# Patient Record
Sex: Female | Born: 1990 | Race: Black or African American | Hispanic: No | Marital: Married | Smoking: Never smoker
Health system: Southern US, Community
[De-identification: ages and names within clinical notes are randomized; demographics above are authoritative.]

## PROBLEM LIST (undated history)

## (undated) ENCOUNTER — Inpatient Hospital Stay (HOSPITAL_COMMUNITY): Payer: Self-pay

## (undated) DIAGNOSIS — R51 Headache: Secondary | ICD-10-CM

## (undated) DIAGNOSIS — O1420 HELLP syndrome (HELLP), unspecified trimester: Secondary | ICD-10-CM

## (undated) DIAGNOSIS — F329 Major depressive disorder, single episode, unspecified: Secondary | ICD-10-CM

## (undated) DIAGNOSIS — F419 Anxiety disorder, unspecified: Secondary | ICD-10-CM

## (undated) DIAGNOSIS — F32A Depression, unspecified: Secondary | ICD-10-CM

## (undated) DIAGNOSIS — N83209 Unspecified ovarian cyst, unspecified side: Secondary | ICD-10-CM

## (undated) DIAGNOSIS — Z9851 Tubal ligation status: Secondary | ICD-10-CM

## (undated) DIAGNOSIS — O139 Gestational [pregnancy-induced] hypertension without significant proteinuria, unspecified trimester: Secondary | ICD-10-CM

## (undated) DIAGNOSIS — O34219 Maternal care for unspecified type scar from previous cesarean delivery: Secondary | ICD-10-CM

## (undated) DIAGNOSIS — O1423 HELLP syndrome (HELLP), third trimester: Secondary | ICD-10-CM

## (undated) DIAGNOSIS — Z98891 History of uterine scar from previous surgery: Secondary | ICD-10-CM

## (undated) HISTORY — PX: TUBAL LIGATION: SHX77

## (undated) SURGERY — Surgical Case
Anesthesia: *Unknown

---
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
## 2008-06-28 ENCOUNTER — Emergency Department (HOSPITAL_COMMUNITY): Admission: EM | Admit: 2008-06-28 | Discharge: 2008-06-28 | Payer: Self-pay | Admitting: Emergency Medicine

## 2008-10-23 ENCOUNTER — Emergency Department (HOSPITAL_COMMUNITY): Admission: EM | Admit: 2008-10-23 | Discharge: 2008-10-24 | Payer: Self-pay | Admitting: Emergency Medicine

## 2009-02-06 ENCOUNTER — Emergency Department (HOSPITAL_COMMUNITY): Admission: EM | Admit: 2009-02-06 | Discharge: 2009-02-07 | Payer: Self-pay | Admitting: Emergency Medicine

## 2009-05-01 ENCOUNTER — Inpatient Hospital Stay (HOSPITAL_COMMUNITY): Admission: AD | Admit: 2009-05-01 | Discharge: 2009-05-01 | Payer: Self-pay | Admitting: Obstetrics and Gynecology

## 2009-05-10 DIAGNOSIS — O1423 HELLP syndrome (HELLP), third trimester: Secondary | ICD-10-CM

## 2009-05-10 HISTORY — DX: HELLP syndrome (HELLP), third trimester: O14.23

## 2009-07-21 ENCOUNTER — Inpatient Hospital Stay (HOSPITAL_COMMUNITY): Admission: AD | Admit: 2009-07-21 | Discharge: 2009-07-21 | Payer: Self-pay | Admitting: Obstetrics and Gynecology

## 2009-08-16 ENCOUNTER — Inpatient Hospital Stay (HOSPITAL_COMMUNITY): Admission: AD | Admit: 2009-08-16 | Discharge: 2009-08-21 | Payer: Self-pay | Admitting: Obstetrics and Gynecology

## 2009-08-17 ENCOUNTER — Encounter (INDEPENDENT_AMBULATORY_CARE_PROVIDER_SITE_OTHER): Payer: Self-pay | Admitting: Obstetrics and Gynecology

## 2010-07-06 ENCOUNTER — Emergency Department (HOSPITAL_COMMUNITY): Payer: Medicaid Other

## 2010-07-06 ENCOUNTER — Emergency Department (HOSPITAL_COMMUNITY)
Admission: EM | Admit: 2010-07-06 | Discharge: 2010-07-06 | Disposition: A | Discharge status: Home / Self Care | Payer: Medicaid Other | Attending: Emergency Medicine | Admitting: Emergency Medicine

## 2010-07-06 DIAGNOSIS — N949 Unspecified condition associated with female genital organs and menstrual cycle: Secondary | ICD-10-CM | POA: Insufficient documentation

## 2010-07-06 DIAGNOSIS — D259 Leiomyoma of uterus, unspecified: Secondary | ICD-10-CM | POA: Insufficient documentation

## 2010-07-06 DIAGNOSIS — R1031 Right lower quadrant pain: Secondary | ICD-10-CM | POA: Insufficient documentation

## 2010-07-06 DIAGNOSIS — N898 Other specified noninflammatory disorders of vagina: Secondary | ICD-10-CM | POA: Insufficient documentation

## 2010-07-06 DIAGNOSIS — J45909 Unspecified asthma, uncomplicated: Secondary | ICD-10-CM | POA: Insufficient documentation

## 2010-07-06 LAB — POCT PREGNANCY, URINE: Preg Test, Ur: NEGATIVE

## 2010-07-06 LAB — DIFFERENTIAL
Basophils Absolute: 0 10*3/uL (ref 0.0–0.1)
Basophils Relative: 0 % (ref 0–1)
Neutro Abs: 5 10*3/uL (ref 1.7–7.7)
Neutrophils Relative %: 70 % (ref 43–77)

## 2010-07-06 LAB — COMPREHENSIVE METABOLIC PANEL
Calcium: 9.4 mg/dL (ref 8.4–10.5)
Chloride: 104 mEq/L (ref 96–112)
Creatinine, Ser: 0.57 mg/dL (ref 0.4–1.2)
GFR calc Af Amer: >60 mL/min (ref >60)
Glucose, Bld: 96 mg/dL (ref 70–99)
Total Protein: 7.4 g/dL (ref 6.0–8.3)

## 2010-07-06 LAB — URINE MICROSCOPIC-ADD ON

## 2010-07-06 LAB — URINALYSIS, ROUTINE W REFLEX MICROSCOPIC
Ketones, ur: NEGATIVE mg/dL
Protein, ur: NEGATIVE mg/dL
Specific Gravity, Urine: 1.02 (ref 1.005–1.030)
Urine Glucose, Fasting: NEGATIVE mg/dL
Urobilinogen, UA: 0.2 mg/dL (ref 0.0–1.0)

## 2010-07-06 LAB — CBC
HCT: 38.3 % (ref 36.0–46.0)
MCHC: 34.2 g/dL (ref 30.0–36.0)
MCV: 80.6 fL (ref 78.0–100.0)
Platelets: 266 10*3/uL (ref 150–400)

## 2010-07-07 LAB — GC/CHLAMYDIA PROBE AMP, GENITAL: GC Probe Amp, Genital: NEGATIVE

## 2010-07-29 LAB — COMPREHENSIVE METABOLIC PANEL
ALT: 125 U/L — ABNORMAL HIGH (ref 0–35)
ALT: 235 U/L — ABNORMAL HIGH (ref 0–35)
ALT: 88 U/L — ABNORMAL HIGH (ref 0–35)
ALT: 95 U/L — ABNORMAL HIGH (ref 0–35)
AST: 113 U/L — ABNORMAL HIGH (ref 0–37)
AST: 60 U/L — ABNORMAL HIGH (ref 0–37)
AST: 95 U/L — ABNORMAL HIGH (ref 0–37)
Albumin: 2.3 g/dL — ABNORMAL LOW (ref 3.5–5.2)
Albumin: 2.3 g/dL — ABNORMAL LOW (ref 3.5–5.2)
Albumin: 2.4 g/dL — ABNORMAL LOW (ref 3.5–5.2)
Albumin: 3 g/dL — ABNORMAL LOW (ref 3.5–5.2)
Alkaline Phosphatase: 119 U/L — ABNORMAL HIGH (ref 39–117)
Alkaline Phosphatase: 83 U/L (ref 39–117)
Alkaline Phosphatase: 87 U/L (ref 39–117)
Alkaline Phosphatase: 97 U/L (ref 39–117)
BUN: 7 mg/dL (ref 6–23)
BUN: 9 mg/dL (ref 6–23)
CO2: 22 mEq/L (ref 19–32)
CO2: 25 mEq/L (ref 19–32)
CO2: 27 mEq/L (ref 19–32)
CO2: 28 mEq/L (ref 19–32)
CO2: 29 mEq/L (ref 19–32)
Calcium: 6.3 mg/dL — CL (ref 8.4–10.5)
Calcium: 7.8 mg/dL — ABNORMAL LOW (ref 8.4–10.5)
Chloride: 101 mEq/L (ref 96–112)
Chloride: 104 mEq/L (ref 96–112)
Chloride: 104 mEq/L (ref 96–112)
Chloride: 105 mEq/L (ref 96–112)
Chloride: 99 mEq/L (ref 96–112)
Creatinine, Ser: 0.66 mg/dL (ref 0.4–1.2)
Creatinine, Ser: 0.68 mg/dL (ref 0.4–1.2)
Creatinine, Ser: 0.69 mg/dL (ref 0.4–1.2)
Creatinine, Ser: 0.75 mg/dL (ref 0.4–1.2)
Creatinine, Ser: 0.89 mg/dL (ref 0.4–1.2)
GFR calc Af Amer: >60 mL/min (ref >60)
GFR calc Af Amer: >60 mL/min (ref >60)
GFR calc Af Amer: >60 mL/min (ref >60)
GFR calc Af Amer: >60 mL/min (ref >60)
GFR calc non Af Amer: >60 mL/min (ref >60)
GFR calc non Af Amer: >60 mL/min (ref >60)
GFR calc non Af Amer: >60 mL/min (ref >60)
GFR calc non Af Amer: >60 mL/min (ref >60)
Glucose, Bld: 89 mg/dL (ref 70–99)
Glucose, Bld: 94 mg/dL (ref 70–99)
Glucose, Bld: 94 mg/dL (ref 70–99)
Potassium: 4 mEq/L (ref 3.5–5.1)
Potassium: 4.1 mEq/L (ref 3.5–5.1)
Sodium: 134 mEq/L — ABNORMAL LOW (ref 135–145)
Sodium: 135 mEq/L (ref 135–145)
Sodium: 135 mEq/L (ref 135–145)
Total Bilirubin: 0.4 mg/dL (ref 0.3–1.2)
Total Bilirubin: 0.5 mg/dL (ref 0.3–1.2)
Total Bilirubin: 0.5 mg/dL (ref 0.3–1.2)
Total Bilirubin: 0.5 mg/dL (ref 0.3–1.2)
Total Protein: 5 g/dL — ABNORMAL LOW (ref 6.0–8.3)
Total Protein: 5.5 g/dL — ABNORMAL LOW (ref 6.0–8.3)
Total Protein: 6.4 g/dL (ref 6.0–8.3)

## 2010-07-29 LAB — LIPASE, BLOOD: Lipase: 21 U/L (ref 11–59)

## 2010-07-29 LAB — URINALYSIS, ROUTINE W REFLEX MICROSCOPIC
Bilirubin Urine: NEGATIVE
Ketones, ur: NEGATIVE mg/dL
Leukocytes, UA: NEGATIVE
Nitrite: NEGATIVE
Specific Gravity, Urine: 1.01 (ref 1.005–1.030)
Specific Gravity, Urine: 1.02 (ref 1.005–1.030)
Urobilinogen, UA: 0.2 mg/dL (ref 0.0–1.0)
pH: 7.5 (ref 5.0–8.0)

## 2010-07-29 LAB — CBC
HCT: 23.8 % — ABNORMAL LOW (ref 36.0–46.0)
HCT: 33.9 % — ABNORMAL LOW (ref 36.0–46.0)
HCT: 38.3 % (ref 36.0–46.0)
Hemoglobin: 11.7 g/dL — ABNORMAL LOW (ref 12.0–15.0)
Hemoglobin: 7.5 g/dL — ABNORMAL LOW (ref 12.0–15.0)
Hemoglobin: 7.6 g/dL — ABNORMAL LOW (ref 12.0–15.0)
Hemoglobin: 8.2 g/dL — ABNORMAL LOW (ref 12.0–15.0)
MCHC: 33.5 g/dL (ref 30.0–36.0)
MCHC: 34.3 g/dL (ref 30.0–36.0)
MCHC: 34.6 g/dL (ref 30.0–36.0)
MCV: 85.1 fL (ref 78.0–100.0)
MCV: 85.2 fL (ref 78.0–100.0)
MCV: 85.7 fL (ref 78.0–100.0)
MCV: 86.5 fL (ref 78.0–100.0)
MCV: 86.9 fL (ref 78.0–100.0)
Platelets: 120 10*3/uL — ABNORMAL LOW (ref 150–400)
Platelets: 123 10*3/uL — ABNORMAL LOW (ref 150–400)
Platelets: 88 10*3/uL — ABNORMAL LOW (ref 150–400)
RBC: 2.57 MIL/uL — ABNORMAL LOW (ref 3.87–5.11)
RBC: 2.57 MIL/uL — ABNORMAL LOW (ref 3.87–5.11)
RBC: 2.79 MIL/uL — ABNORMAL LOW (ref 3.87–5.11)
RBC: 4.5 MIL/uL (ref 3.87–5.11)
RDW: 12.6 % (ref 11.5–15.5)
RDW: 13 % (ref 11.5–15.5)
RDW: 13.1 % (ref 11.5–15.5)
WBC: 10.4 10*3/uL (ref 4.0–10.5)
WBC: 7.6 10*3/uL (ref 4.0–10.5)
WBC: 9.2 10*3/uL (ref 4.0–10.5)
WBC: 9.8 10*3/uL (ref 4.0–10.5)

## 2010-07-29 LAB — LACTATE DEHYDROGENASE
LDH: 1571 U/L — ABNORMAL HIGH (ref 94–250)
LDH: 464 U/L — ABNORMAL HIGH (ref 94–250)
LDH: 614 U/L — ABNORMAL HIGH (ref 94–250)

## 2010-07-29 LAB — URINE MICROSCOPIC-ADD ON

## 2010-07-29 LAB — RPR: RPR Ser Ql: NONREACTIVE

## 2010-07-29 LAB — URIC ACID: Uric Acid, Serum: 8.1 mg/dL — ABNORMAL HIGH (ref 2.4–7.0)

## 2010-07-29 LAB — URINE CULTURE

## 2010-07-29 LAB — MAGNESIUM
Magnesium: 6 mg/dL — ABNORMAL HIGH (ref 1.5–2.5)
Magnesium: 6.3 mg/dL (ref 1.5–2.5)

## 2010-07-29 LAB — AMYLASE: Amylase: 70 U/L (ref 0–105)

## 2010-07-29 LAB — MRSA PCR SCREENING: MRSA by PCR: NEGATIVE

## 2010-08-10 LAB — URINALYSIS, ROUTINE W REFLEX MICROSCOPIC
Bilirubin Urine: NEGATIVE
Hgb urine dipstick: NEGATIVE
Ketones, ur: NEGATIVE mg/dL
Nitrite: NEGATIVE
Urobilinogen, UA: 0.2 mg/dL (ref 0.0–1.0)

## 2010-08-10 LAB — WET PREP, GENITAL

## 2010-08-14 LAB — WET PREP, GENITAL
Trich, Wet Prep: NONE SEEN
Yeast Wet Prep HPF POC: NONE SEEN

## 2010-08-14 LAB — DIFFERENTIAL
Basophils Relative: 0 % (ref 0–1)
Eosinophils Absolute: 0.1 10*3/uL (ref 0.0–0.7)
Monocytes Relative: 7 % (ref 3–12)
Neutro Abs: 6.6 10*3/uL (ref 1.7–7.7)
Neutrophils Relative %: 70 % (ref 43–77)

## 2010-08-14 LAB — POCT I-STAT, CHEM 8
Creatinine, Ser: 0.6 mg/dL (ref 0.4–1.2)
HCT: 41 % (ref 36.0–46.0)
Hemoglobin: 13.9 g/dL (ref 12.0–15.0)
Potassium: 3.5 mEq/L (ref 3.5–5.1)
Sodium: 136 mEq/L (ref 135–145)
TCO2: 25 mmol/L (ref 0–100)

## 2010-08-14 LAB — URINALYSIS, ROUTINE W REFLEX MICROSCOPIC
Hgb urine dipstick: NEGATIVE
Nitrite: NEGATIVE
Protein, ur: NEGATIVE mg/dL
Specific Gravity, Urine: 1.029 (ref 1.005–1.030)
Urobilinogen, UA: 1 mg/dL (ref 0.0–1.0)

## 2010-08-14 LAB — CBC
MCHC: 34.4 g/dL (ref 30.0–36.0)
Platelets: 255 10*3/uL (ref 150–400)
RBC: 4.58 MIL/uL (ref 3.87–5.11)
WBC: 9.5 10*3/uL (ref 4.0–10.5)

## 2010-08-14 LAB — GC/CHLAMYDIA PROBE AMP, GENITAL: GC Probe Amp, Genital: NEGATIVE

## 2010-08-14 LAB — HCG, QUANTITATIVE, PREGNANCY: hCG, Beta Chain, Quant, S: 31713 m[IU]/mL — ABNORMAL HIGH (ref <5)

## 2010-08-14 LAB — POCT PREGNANCY, URINE: Preg Test, Ur: POSITIVE

## 2010-12-18 ENCOUNTER — Emergency Department (HOSPITAL_COMMUNITY)
Admission: EM | Admit: 2010-12-18 | Discharge: 2010-12-19 | Disposition: A | Discharge status: Home / Self Care | Payer: Self-pay | Attending: Emergency Medicine | Admitting: Emergency Medicine

## 2010-12-18 DIAGNOSIS — B86 Scabies: Secondary | ICD-10-CM | POA: Insufficient documentation

## 2010-12-18 DIAGNOSIS — L299 Pruritus, unspecified: Secondary | ICD-10-CM | POA: Insufficient documentation

## 2010-12-23 ENCOUNTER — Emergency Department (HOSPITAL_COMMUNITY)
Admission: EM | Admit: 2010-12-23 | Discharge: 2010-12-24 | Disposition: A | Discharge status: Home / Self Care | Payer: Self-pay | Attending: Emergency Medicine | Admitting: Emergency Medicine

## 2010-12-23 DIAGNOSIS — L42 Pityriasis rosea: Secondary | ICD-10-CM | POA: Insufficient documentation

## 2010-12-23 DIAGNOSIS — J45909 Unspecified asthma, uncomplicated: Secondary | ICD-10-CM | POA: Insufficient documentation

## 2011-05-11 HISTORY — DX: Maternal care for unspecified type scar from previous cesarean delivery: O34.219

## 2011-05-15 ENCOUNTER — Encounter: Payer: Self-pay | Admitting: *Deleted

## 2011-05-15 ENCOUNTER — Emergency Department (HOSPITAL_COMMUNITY)
Admission: EM | Admit: 2011-05-15 | Discharge: 2011-05-15 | Disposition: A | Discharge status: Home / Self Care | Payer: Self-pay | Attending: Emergency Medicine | Admitting: Emergency Medicine

## 2011-05-15 DIAGNOSIS — K029 Dental caries, unspecified: Secondary | ICD-10-CM | POA: Insufficient documentation

## 2011-05-15 DIAGNOSIS — K089 Disorder of teeth and supporting structures, unspecified: Secondary | ICD-10-CM | POA: Insufficient documentation

## 2011-05-15 DIAGNOSIS — R220 Localized swelling, mass and lump, head: Secondary | ICD-10-CM | POA: Insufficient documentation

## 2011-05-15 MED ORDER — HYDROCODONE-ACETAMINOPHEN 5-325 MG PO TABS: 1.0000 | ORAL_TABLET | Freq: Four times a day (QID) | ORAL | Status: AC | PRN

## 2011-05-15 MED ORDER — IBUPROFEN 800 MG PO TABS
800.0000 mg | ORAL_TABLET | Freq: Once | ORAL | Status: AC
Start: 2011-05-15 — End: 2011-05-15
  Administered 2011-05-15: 800 mg via ORAL
  Filled 2011-05-15: qty 1

## 2011-05-15 MED ORDER — PENICILLIN V POTASSIUM 250 MG PO TABS: 500.0000 mg | ORAL_TABLET | Freq: Four times a day (QID) | ORAL | Status: DC

## 2011-05-15 NOTE — ED Notes (Signed)
Reports onset of left upper toothache last night. Airway intact.

## 2011-05-15 NOTE — ED Provider Notes (Signed)
Medical screening examination/treatment/procedure(s) were performed by non-physician practitioner and as supervising physician I was immediately available for consultation/collaboration.   Fred Hammes, MD 05/15/11 1340 

## 2011-05-15 NOTE — ED Provider Notes (Signed)
History     CSN: [id number]  Arrival date & time 05/15/11  0919   First MD Initiated Contact with Patient 05/15/11 [phone]      Chief Complaint  Patient presents with  . Dental Pain    (Consider location/radiation/quality/duration/timing/severity/associated sxs/prior treatment) Patient is a 21 y.o. female presenting with tooth pain. The history is provided by the patient.  Dental PainThe primary symptoms include mouth pain. Primary symptoms do not include dental injury, oral bleeding, oral lesions, headaches, fever, shortness of breath or sore throat. The symptoms began yesterday. The symptoms are unchanged. The symptoms are new. The symptoms occur constantly.  Mouth pain occurs constantly. Affected locations include: teeth and gum(s).  Additional symptoms include: dental sensitivity to temperature, gum swelling and gum tenderness. Additional symptoms do not include: purulent gums, trismus, facial swelling, trouble swallowing, pain with swallowing, drooling, ear pain, nosebleeds and swollen glands. Medical issues do not include: periodontal disease.    History reviewed. No pertinent past medical history.  History reviewed. No pertinent past surgical history.  History reviewed. No pertinent family history.  History  Substance Use Topics  . Smoking status: Never Smoker   . Smokeless tobacco: Not on file  . Alcohol Use: No     Review of Systems  Constitutional: Negative for fever and chills.  HENT: Positive for dental problem. Negative for ear pain, nosebleeds, congestion, sore throat, facial swelling, drooling, trouble swallowing, neck pain and neck stiffness.   Eyes: Negative for pain and visual disturbance.  Respiratory: Negative for shortness of breath.   Cardiovascular: Negative for chest pain.  Gastrointestinal: Negative for nausea, vomiting and abdominal pain.  Genitourinary: Negative for dysuria and hematuria.  Musculoskeletal: Negative for back pain.  Skin: Negative for  rash.  Neurological: Negative for syncope, speech difficulty, weakness and headaches.  Psychiatric/Behavioral: Negative for confusion.    Allergies  Review of patient's allergies indicates no known allergies.  Home Medications   Current Outpatient Rx  Name Route Sig Dispense Refill  . ACETAMINOPHEN-CODEINE #3 300-30 MG PO TABS Oral Take 1 tablet by mouth every 4 (four) hours as needed. For pain       BP 126/59  Pulse 106  Temp(Src) 98.9 F (37.2 C) (Oral)  Resp 18  SpO2 100%  LMP 04/19/2011  Physical Exam  Nursing note and vitals reviewed. Constitutional: She is oriented to person, place, and time. She appears well-developed and well-nourished. She appears distressed.  HENT:  Head: Normocephalic and atraumatic. No trismus in the jaw.  Right Ear: External ear normal.  Left Ear: External ear normal.  Nose: Nose normal.  Mouth/Throat: Oropharynx is clear and moist and mucous membranes are normal. Dental caries present.    Eyes: Pupils are equal, round, and reactive to light.  Neck: Normal range of motion. Neck supple.  Cardiovascular: Normal rate and regular rhythm.        HR 100 on palpation  Pulmonary/Chest: Effort normal. No respiratory distress.  Abdominal: Soft. She exhibits no distension. There is no tenderness.  Musculoskeletal: She exhibits no edema and no tenderness.  Lymphadenopathy:    She has no cervical adenopathy.  Neurological: She is alert and oriented to person, place, and time. No cranial nerve deficit.  Skin: Skin is warm and dry. No rash noted. No erythema.    ED Course  Procedures (including critical care time)  Labs Reviewed - No data to display No results found.   1. Dental caries       MDM  Dental caries with  surrounding gingiva erythematous, TTP but no swelling/purulence to suggest abscess.        Elwyn Reach [place], [place] 05/15/11 1017

## 2011-05-17 ENCOUNTER — Encounter (HOSPITAL_COMMUNITY): Payer: Self-pay | Admitting: Emergency Medicine

## 2011-05-17 ENCOUNTER — Encounter (HOSPITAL_COMMUNITY): Payer: Self-pay

## 2011-05-17 ENCOUNTER — Emergency Department (HOSPITAL_COMMUNITY)
Admission: EM | Admit: 2011-05-17 | Discharge: 2011-05-17 | Disposition: A | Discharge status: Home / Self Care | Payer: Self-pay | Attending: Emergency Medicine | Admitting: Emergency Medicine

## 2011-05-17 ENCOUNTER — Emergency Department (HOSPITAL_COMMUNITY): Payer: Self-pay

## 2011-05-17 ENCOUNTER — Emergency Department (HOSPITAL_COMMUNITY)
Admission: EM | Admit: 2011-05-17 | Discharge: 2011-05-17 | Disposition: A | Discharge status: Nursing Home (Includes ICF) | Payer: Self-pay | Source: Home / Self Care | Attending: Emergency Medicine | Admitting: Emergency Medicine

## 2011-05-17 DIAGNOSIS — K089 Disorder of teeth and supporting structures, unspecified: Secondary | ICD-10-CM

## 2011-05-17 DIAGNOSIS — R220 Localized swelling, mass and lump, head: Secondary | ICD-10-CM | POA: Insufficient documentation

## 2011-05-17 DIAGNOSIS — K047 Periapical abscess without sinus: Secondary | ICD-10-CM | POA: Insufficient documentation

## 2011-05-17 DIAGNOSIS — R221 Localized swelling, mass and lump, neck: Secondary | ICD-10-CM | POA: Insufficient documentation

## 2011-05-17 DIAGNOSIS — K0889 Other specified disorders of teeth and supporting structures: Secondary | ICD-10-CM

## 2011-05-17 MED ORDER — IBUPROFEN 800 MG PO TABS
800.0000 mg | ORAL_TABLET | Freq: Once | ORAL | Status: AC
  Administered 2011-05-17: 800 mg via ORAL

## 2011-05-17 MED ORDER — IBUPROFEN 800 MG PO TABS
ORAL_TABLET | ORAL | Status: AC
  Filled 2011-05-17: qty 1

## 2011-05-17 MED ORDER — LIDOCAINE VISCOUS 2 % MT SOLN
20.0000 mL | Freq: Once | OROMUCOSAL | Status: AC
  Administered 2011-05-17: 20 mL via OROMUCOSAL

## 2011-05-17 MED ORDER — CLINDAMYCIN PHOSPHATE 300 MG/50ML IV SOLN
300.0000 mg | Freq: Once | INTRAVENOUS | Status: AC
  Administered 2011-05-17: 300 mg via INTRAVENOUS
  Filled 2011-05-17: qty 50

## 2011-05-17 NOTE — ED Notes (Signed)
Faxed records to [facility].

## 2011-05-17 NOTE — ED Notes (Signed)
Pt c/o left upper tooth pain and swelling x 3 days

## 2011-05-17 NOTE — ED Notes (Signed)
C/o dental abscess (lt upper).  Seen in ED on Saturday for same. On Penicillin VK and hydrocodone but sx are worse.  Can't afford a dentist.

## 2011-05-17 NOTE — ED Provider Notes (Signed)
History     CSN: [id number]  Arrival date & time 05/17/11  4098   First MD Initiated Contact with Patient 05/17/11 1024      Chief Complaint  Patient presents with  . Dental Pain    (Consider location/radiation/quality/duration/timing/severity/associated sxs/prior treatment) HPI Comments: Pt c/o upper left canine tooth pain x 4 days. States this tooth has been chipped for a "long time." pain worse with cold. Slightly better with heat. Seen in ED 2 days ago for same given norco and Pen VK whic pt states is taking as written. No dental abscess at that time. Pt states pain has gotten worse and now has unilateral left facial swelling. No fevers, trismus, drooling, nasal congestion, sinus pain.   Patient is a 21 y.o. female presenting with tooth pain.  Dental PainPrimary symptoms do not include headaches or fever.  Additional symptoms include: facial swelling. Additional symptoms do not include: drooling and ear pain.    History reviewed. No pertinent past medical history.  Past Surgical History  Procedure Date  . Cesarean section     History reviewed. No pertinent family history.  History  Substance Use Topics  . Smoking status: Never Smoker   . Smokeless tobacco: Not on file  . Alcohol Use: No    OB History    Grav Para Term Preterm Abortions TAB SAB Ect Mult Living                  Review of Systems  Constitutional: Negative for fever.  HENT: Positive for facial swelling. Negative for ear pain, congestion, drooling, neck pain and sinus pressure.   Eyes: Negative for photophobia.  Neurological: Negative for headaches.    Allergies  Review of patient's allergies indicates no known allergies.  Home Medications   Current Outpatient Rx  Name Route Sig Dispense Refill  . ACETAMINOPHEN-CODEINE #3 300-30 MG PO TABS Oral Take 1 tablet by mouth every 4 (four) hours as needed. For pain     . HYDROCODONE-ACETAMINOPHEN 5-325 MG PO TABS Oral Take 1-2 tablets by mouth every 6  (six) hours as needed for pain. 15 tablet 0  . PENICILLIN V POTASSIUM 250 MG PO TABS Oral Take 2 tablets (500 mg total) by mouth 4 (four) times daily. 80 tablet 0    BP 127/91  Pulse 84  Temp(Src) 98.7 F (37.1 C) (Oral)  Resp 18  SpO2 100%  LMP 05/17/2011  Physical Exam  Nursing note and vitals reviewed. Constitutional: She is oriented to person, place, and time. She appears well-developed and well-nourished. She appears distressed.       Appears moderately uncomfortable  HENT:  Head: Normocephalic and atraumatic.  Nose: Nose normal.  Mouth/Throat: Uvula is midline and oropharynx is clear and moist.    Eyes: EOM are normal. Pupils are equal, round, and reactive to light.  Neck: Normal range of motion. Neck supple.  Cardiovascular: Regular rhythm.   Pulmonary/Chest: Effort normal.  Abdominal: She exhibits no distension.  Musculoskeletal: Normal range of motion.  Lymphadenopathy:    She has no cervical adenopathy.  Neurological: She is alert and oriented to person, place, and time.  Skin: Skin is warm and dry.  Psychiatric: She has a normal mood and affect. Her behavior is normal. Judgment and thought content normal.    ED Course  Procedures (including critical care time)  Labs Reviewed - No data to display No results found.   No diagnosis found.    MDM  Previous chart reviewed as noted in  HPI. Pt with 4 days upper left cuspid pain. Seen in ED 2 days ago for same started on pcn VK, norco. States pain, facial swelling getting worse despite taking PCN. Has some unilateral facial swelling with mild obliteraion nasolabial fold. Gum, sinus exquisitely tender. Concern for canine space abscess especially as is worsening despite appropriate OP tx. Gave motrin, viscous lidocaine here- pt in mod amt pain- and transferring.   Luiz Blare, MD 05/17/11 1143

## 2011-05-17 NOTE — ED Notes (Signed)
Pt seen in dept over weekend for tooth pain. rx'd pcn and vicodin. Pt returns today via ucc stating no relief. Pt does state that oral gel and ibuprofen given at ucc has helped. Denies fever, trouble swallowing.

## 2011-05-17 NOTE — ED Provider Notes (Signed)
Pt care assumed from [place]. Pt with worsening dental abscess pain after approx 48 hours PCN tx. Afebrile, VSS. Spoke with Dr Melynda Ripple, DDS who can follow-up with the patient in his office tomorrow. He recommends continuing PCN until that time. Pt advised of this plan and voices understanding.  Elwyn Reach [place], [place] 05/17/11 1614

## 2011-05-17 NOTE — ED Notes (Signed)
Returned from xray

## 2011-05-17 NOTE — ED Provider Notes (Signed)
History     CSN: [id number]  Arrival date & time 05/17/11  1212   First MD Initiated Contact with Patient 05/17/11 1314      Chief Complaint  Patient presents with  . Abscess  . Dental Pain    (Consider location/radiation/quality/duration/timing/severity/associated sxs/prior treatment) HPI Comments: Patient sent from urgent care today for left upper dental abscess.  That continues despite penicillin therapy.  Patient reports the pain began four days ago with a bulge in her,.  States she's been taking the penicillin VK since it was prescribed two days ago and she is having progressive swelling and pain.  Pain is currently controlled with 800 mg ibuprofen, and viscous lidocaine given to her at urgent care.    Patient is a 21 y.o. female presenting with abscess and tooth pain. The history is provided by the patient and medical records.  Abscess   Dental Pain   History reviewed. No pertinent past medical history.  Past Surgical History  Procedure Date  . Cesarean section     History reviewed. No pertinent family history.  History  Substance Use Topics  . Smoking status: Never Smoker   . Smokeless tobacco: Not on file  . Alcohol Use: No    OB History    Grav Para Term Preterm Abortions TAB SAB Ect Mult Living                  Review of Systems  All other systems reviewed and are negative.    Allergies  Review of patient's allergies indicates no known allergies.  Home Medications   Current Outpatient Rx  Name Route Sig Dispense Refill  . ACETAMINOPHEN-CODEINE #3 300-30 MG PO TABS Oral Take 1 tablet by mouth every 4 (four) hours as needed. For pain     . HYDROCODONE-ACETAMINOPHEN 5-325 MG PO TABS Oral Take 1-2 tablets by mouth every 6 (six) hours as needed for pain. 15 tablet 0  . PENICILLIN V POTASSIUM 250 MG PO TABS Oral Take 500 mg by mouth 4 (four) times daily. Take for 7 days  Rx. Filled 05/15/11       BP 124/78  Pulse 76  Temp(Src) 99.6 F (37.6 C) (Oral)   Resp 18  SpO2 99%  LMP 05/17/2011  Physical Exam  Nursing note and vitals reviewed. Constitutional: She is oriented to person, place, and time. She appears well-developed and well-nourished.  HENT:  Head: Normocephalic and atraumatic.  Mouth/Throat: Uvula is midline and oropharynx is clear and moist. No posterior oropharyngeal edema.    Neck: Neck supple.  Pulmonary/Chest: Effort normal.  Neurological: She is alert and oriented to person, place, and time.    ED Course  Procedures (including critical care time)  Labs Reviewed - No data to display Dg Orthopantogram  05/17/2011  *RADIOLOGY REPORT*  Clinical Data: Left upper dental abscess.  Pain.  Comparison: None.  Findings: There are multiple missing teeth.  There is no definable periapical abscess.  Osseous structures appear normal.  Maxillary sinuses are clear.  IMPRESSION: No definable abscess in the maxilla or mandible.  Original Report Authenticated By: Gwynn Burly, M.D.   3:24 PM Discussed patient with Dr Oletta Lamas.  Plan is for no further imaging, consult to dentist for close follow up today or tomorrow.    Lorenz Coaster, PA-C, to discuss patient with dentist on call to establish close follow up and treatment recommendations.   1. Dental abscess       MDM  Patient with dental abscess that  is getting progressively worse despite Penicillin therapy.  Discussed patient with Lorenz Coaster, PA-C, who assumes care of patient at change of shift.           Rise Patience, [place] 05/17/11 1616

## 2011-05-18 ENCOUNTER — Ambulatory Visit
Admission: RE | Admit: 2011-05-18 | Discharge: 2011-05-18 | Disposition: A | Discharge status: Home / Self Care | Payer: PRIVATE HEALTH INSURANCE | Source: Ambulatory Visit | Attending: Occupational Medicine | Admitting: Occupational Medicine

## 2011-05-18 ENCOUNTER — Other Ambulatory Visit: Payer: Self-pay | Admitting: Occupational Medicine

## 2011-05-18 DIAGNOSIS — Z021 Encounter for pre-employment examination: Secondary | ICD-10-CM

## 2011-05-18 NOTE — ED Provider Notes (Signed)
Medical screening examination/treatment/procedure(s) were performed by non-physician practitioner and as supervising physician I was immediately available for consultation/collaboration. Katiya Fike Y.   Gavin Pound. Adriane Guglielmo, MD 05/18/11 1236

## 2011-05-18 NOTE — ED Provider Notes (Signed)
Medical screening examination/treatment/procedure(s) were performed by non-physician practitioner and as supervising physician I was immediately available for consultation/collaboration. Rechelle Niebla Y.   Gavin Pound. Oletta Lamas, MD 05/18/11 [phone]

## 2011-08-23 ENCOUNTER — Inpatient Hospital Stay (HOSPITAL_COMMUNITY)
Admission: AD | Admit: 2011-08-23 | Discharge: 2011-08-24 | Disposition: A | Discharge status: Hospice | Payer: Medicaid Other | Source: Ambulatory Visit | Attending: Obstetrics & Gynecology | Admitting: Obstetrics & Gynecology

## 2011-08-23 ENCOUNTER — Encounter (HOSPITAL_COMMUNITY): Payer: Self-pay | Admitting: *Deleted

## 2011-08-23 DIAGNOSIS — A499 Bacterial infection, unspecified: Secondary | ICD-10-CM

## 2011-08-23 DIAGNOSIS — R109 Unspecified abdominal pain: Secondary | ICD-10-CM | POA: Insufficient documentation

## 2011-08-23 DIAGNOSIS — O2390 Unspecified genitourinary tract infection in pregnancy, unspecified trimester: Secondary | ICD-10-CM | POA: Insufficient documentation

## 2011-08-23 DIAGNOSIS — Z3490 Encounter for supervision of normal pregnancy, unspecified, unspecified trimester: Secondary | ICD-10-CM

## 2011-08-23 DIAGNOSIS — N760 Acute vaginitis: Secondary | ICD-10-CM | POA: Insufficient documentation

## 2011-08-23 DIAGNOSIS — B9689 Other specified bacterial agents as the cause of diseases classified elsewhere: Secondary | ICD-10-CM | POA: Insufficient documentation

## 2011-08-23 LAB — WET PREP, GENITAL
Trich, Wet Prep: NONE SEEN
Yeast Wet Prep HPF POC: NONE SEEN

## 2011-08-23 LAB — URINALYSIS, ROUTINE W REFLEX MICROSCOPIC
Bilirubin Urine: NEGATIVE
Glucose, UA: NEGATIVE mg/dL
Hgb urine dipstick: NEGATIVE
Ketones, ur: NEGATIVE mg/dL
Leukocytes, UA: NEGATIVE
Protein, ur: NEGATIVE mg/dL
Specific Gravity, Urine: <1.005 — ABNORMAL LOW (ref 1.005–1.030)
Urobilinogen, UA: 0.2 mg/dL (ref 0.0–1.0)

## 2011-08-23 LAB — OB RESULTS CONSOLE HEPATITIS B SURFACE ANTIGEN: Hepatitis B Surface Ag: NEGATIVE

## 2011-08-23 LAB — CBC
HCT: 37.1 % (ref 36.0–46.0)
Hemoglobin: 12.4 g/dL (ref 12.0–15.0)
MCH: 27.5 pg (ref 26.0–34.0)
MCHC: 33.4 g/dL (ref 30.0–36.0)
MCV: 82.3 fL (ref 78.0–100.0)
Platelets: 251 10*3/uL (ref 150–400)
RBC: 4.51 MIL/uL (ref 3.87–5.11)
RDW: 12.3 % (ref 11.5–15.5)
WBC: 7.2 10*3/uL (ref 4.0–10.5)

## 2011-08-23 LAB — ABO/RH: ABO/RH(D): A POS

## 2011-08-23 LAB — POCT PREGNANCY, URINE: Preg Test, Ur: POSITIVE — AB

## 2011-08-23 LAB — OB RESULTS CONSOLE ANTIBODY SCREEN: Antibody Screen: NEGATIVE

## 2011-08-23 MED ORDER — METRONIDAZOLE 500 MG PO TABS: 500.0000 mg | ORAL_TABLET | Freq: Two times a day (BID) | ORAL | Status: AC

## 2011-08-23 NOTE — MAU Provider Note (Signed)
History     CSN: [id number]  Arrival date and time: 08/23/11 2050   First Provider Initiated Contact with Patient 08/23/11 2300      Chief Complaint  Patient presents with  . Abdominal Pain   HPI 20 y.o. G2P0100 at [redacted]w[redacted]d with low abd cramping, no bleeding x a few days. Pain mostly occurs with activity, better at rest.    No past medical history on file.  Past Surgical History  Procedure Date  . Cesarean section     No family history on file.  History  Substance Use Topics  . Smoking status: Never Smoker   . Smokeless tobacco: Not on file  . Alcohol Use: No    Allergies: No Known Allergies  Prescriptions prior to admission  Medication Sig Dispense Refill  . Prenatal Vit-Fe Fumarate-FA (PRENATAL MULTIVITAMIN) TABS Take 1 tablet by mouth daily.        Review of Systems  Constitutional: Negative.   Respiratory: Negative.   Cardiovascular: Negative.   Gastrointestinal: Positive for abdominal pain. Negative for nausea, vomiting, diarrhea and constipation.  Genitourinary: Negative for dysuria, urgency, frequency, hematuria and flank pain.       Negative for vaginal bleeding, vaginal discharge  Musculoskeletal: Negative.   Neurological: Negative.   Psychiatric/Behavioral: Negative.    Physical Exam   Blood pressure 131/60, pulse 79, temperature 100.1 F (37.8 C), temperature source Oral, resp. rate 18, height 5\' 7"  (1.702 m), weight 199 lb (90.266 kg), last menstrual period 07/11/2011, SpO2 100.00%.  Physical Exam  Nursing note and vitals reviewed. Constitutional: She is oriented to person, place, and time. She appears well-developed and well-nourished. No distress.  HENT:  Head: Normocephalic and atraumatic.  Cardiovascular: Normal rate and regular rhythm.   Respiratory: Effort normal. No respiratory distress.  GI: Soft. She exhibits no distension and no mass. There is no tenderness. There is no rebound and no guarding.  Genitourinary: There is no rash or  lesion on the right labia. There is no rash or lesion on the left labia. Uterus is not deviated, not enlarged, not fixed and not tender. Cervix exhibits no motion tenderness, no discharge and no friability. Right adnexum displays no mass, no tenderness and no fullness. Left adnexum displays no mass, no tenderness and no fullness. No erythema, tenderness or bleeding around the vagina. Vaginal discharge (white) found.  Neurological: She is alert and oriented to person, place, and time.  Skin: Skin is warm and dry.  Psychiatric: She has a normal mood and affect.    MAU Course  Procedures  Results for orders placed during the hospital encounter of 08/23/11 (from the past 24 hour(s))  URINALYSIS, ROUTINE W REFLEX MICROSCOPIC     Status: Abnormal   Collection Time   08/23/11  9:30 PM      Component Value Range   Color, Urine YELLOW  YELLOW    APPearance CLEAR  CLEAR    Specific Gravity, Urine <1.005 (*) 1.005 - 1.030    pH 7.0  5.0 - 8.0    Glucose, UA NEGATIVE  NEGATIVE (mg/dL)   Hgb urine dipstick NEGATIVE  NEGATIVE    Bilirubin Urine NEGATIVE  NEGATIVE    Ketones, ur NEGATIVE  NEGATIVE (mg/dL)   Protein, ur NEGATIVE  NEGATIVE (mg/dL)   Urobilinogen, UA 0.2  0.0 - 1.0 (mg/dL)   Nitrite NEGATIVE  NEGATIVE    Leukocytes, UA NEGATIVE  NEGATIVE   WET PREP, GENITAL     Status: Abnormal   Collection Time  08/23/11 10:55 PM      Component Value Range   Yeast Wet Prep HPF POC NONE SEEN  NONE SEEN    Trich, Wet Prep NONE SEEN  NONE SEEN    Clue Cells Wet Prep HPF POC MODERATE (*) NONE SEEN    WBC, Wet Prep HPF POC FEW (*) NONE SEEN   POCT PREGNANCY, URINE     Status: Abnormal   Collection Time   08/23/11 10:55 PM      Component Value Range   Preg Test, Ur POSITIVE (*) NEGATIVE   CBC     Status: Normal   Collection Time   08/23/11 11:30 PM      Component Value Range   WBC 7.2  4.0 - 10.5 (K/uL)   RBC 4.51  3.87 - 5.11 (MIL/uL)   Hemoglobin 12.4  12.0 - 15.0 (g/dL)   HCT 16.1  09.6 - 04.5  (%)   MCV 82.3  78.0 - 100.0 (fL)   MCH 27.5  26.0 - 34.0 (pg)   MCHC 33.4  30.0 - 36.0 (g/dL)   RDW 40.9  81.1 - 91.4 (%)   Platelets 251  150 - 400 (K/uL)  ABO/RH     Status: Normal   Collection Time   08/23/11 11:30 PM      Component Value Range   ABO/RH(D) A POS    HCG, QUANTITATIVE, PREGNANCY     Status: Abnormal   Collection Time   08/23/11 11:30 PM      Component Value Range   hCG, Beta Chain, Quant, S 78295 (*) <5 (mIU/mL)   U/S: IUP @ 6.1 weeks, + FHR, small subchorionic hemorrhage  Assessment and Plan  20 y.o. G2P0100 at [redacted]w[redacted]d BV - rx flagyl F/U as scheduled for prenatal care, precautions rev'd    FRAZIER,NATALIE 08/24/2011, 2:01 AM

## 2011-08-23 NOTE — MAU Note (Signed)
Pt reports "I have been having a lot of cramping", states it is mostly with movement. States 3 days ago she had a headache and some swelling in her feet and she is concerned because she had b/p problems with her last pregnancy.  LMP 07/11/2011

## 2011-08-23 NOTE — MAU Note (Signed)
Pt. States that she started cramping a few days ago and it has continued until now, so she came into see if everything is ok.

## 2011-08-24 ENCOUNTER — Inpatient Hospital Stay (HOSPITAL_COMMUNITY): Payer: Medicaid Other

## 2011-08-24 LAB — GC/CHLAMYDIA PROBE AMP, GENITAL: GC Probe Amp, Genital: NEGATIVE

## 2011-08-24 LAB — HCG, QUANTITATIVE, PREGNANCY: hCG, Beta Chain, Quant, S: 28891 m[IU]/mL — ABNORMAL HIGH (ref <5)

## 2011-09-07 ENCOUNTER — Inpatient Hospital Stay (HOSPITAL_COMMUNITY)
Admission: AD | Admit: 2011-09-07 | Discharge: 2011-09-07 | Disposition: A | Discharge status: Home / Self Care | Payer: Medicaid Other | Source: Ambulatory Visit | Attending: Obstetrics and Gynecology | Admitting: Obstetrics and Gynecology

## 2011-09-07 ENCOUNTER — Inpatient Hospital Stay (HOSPITAL_COMMUNITY): Payer: Medicaid Other

## 2011-09-07 ENCOUNTER — Encounter (HOSPITAL_COMMUNITY): Payer: Self-pay | Admitting: *Deleted

## 2011-09-07 DIAGNOSIS — R1032 Left lower quadrant pain: Secondary | ICD-10-CM | POA: Insufficient documentation

## 2011-09-07 DIAGNOSIS — R1031 Right lower quadrant pain: Secondary | ICD-10-CM | POA: Insufficient documentation

## 2011-09-07 DIAGNOSIS — O99891 Other specified diseases and conditions complicating pregnancy: Secondary | ICD-10-CM | POA: Insufficient documentation

## 2011-09-07 DIAGNOSIS — O26899 Other specified pregnancy related conditions, unspecified trimester: Secondary | ICD-10-CM

## 2011-09-07 DIAGNOSIS — R109 Unspecified abdominal pain: Secondary | ICD-10-CM

## 2011-09-07 HISTORY — DX: HELLP syndrome (HELLP), unspecified trimester: O14.20

## 2011-09-07 HISTORY — DX: Unspecified ovarian cyst, unspecified side: N83.209

## 2011-09-07 LAB — URINALYSIS, ROUTINE W REFLEX MICROSCOPIC
Glucose, UA: NEGATIVE mg/dL
Ketones, ur: NEGATIVE mg/dL
Leukocytes, UA: NEGATIVE
Nitrite: NEGATIVE
Protein, ur: NEGATIVE mg/dL
Urobilinogen, UA: 0.2 mg/dL (ref 0.0–1.0)

## 2011-09-07 LAB — COMPREHENSIVE METABOLIC PANEL
ALT: 14 U/L (ref 0–35)
AST: 17 U/L (ref 0–37)
Albumin: 3.7 g/dL (ref 3.5–5.2)
Alkaline Phosphatase: 46 U/L (ref 39–117)
BUN: 7 mg/dL (ref 6–23)
Chloride: 99 mEq/L (ref 96–112)
Potassium: 4.3 mEq/L (ref 3.5–5.1)
Sodium: 134 mEq/L — ABNORMAL LOW (ref 135–145)
Total Bilirubin: 0.3 mg/dL (ref 0.3–1.2)
Total Protein: 6.9 g/dL (ref 6.0–8.3)

## 2011-09-07 LAB — CBC
MCHC: 34.4 g/dL (ref 30.0–36.0)
Platelets: 217 10*3/uL (ref 150–400)
RDW: 12.3 % (ref 11.5–15.5)
WBC: 7.2 10*3/uL (ref 4.0–10.5)

## 2011-09-07 MED ORDER — OXYCODONE-ACETAMINOPHEN 5-325 MG PO TABS
2.0000 | ORAL_TABLET | Freq: Once | ORAL | Status: AC
  Administered 2011-09-07: 2 via ORAL
  Filled 2011-09-07: qty 2

## 2011-09-07 MED ORDER — OXYCODONE-ACETAMINOPHEN 5-325 MG PO TABS: 2.0000 | ORAL_TABLET | ORAL | Status: AC | PRN

## 2011-09-07 MED ORDER — ONDANSETRON 8 MG PO TBDP
8.0000 mg | ORAL_TABLET | Freq: Once | ORAL | Status: AC
  Administered 2011-09-07: 8 mg via ORAL
  Filled 2011-09-07: qty 1

## 2011-09-07 NOTE — MAU Provider Note (Signed)
Christina [name] y.o.G2P0100 @[redacted]w[redacted]d  by LMP Chief Complaint  Patient presents with  . Back Pain     First Provider Initiated Contact with Patient 09/07/11 0958      SUBJECTIVE  HPI: 8.2 week confirmed IUP w/ sharp RLQ/groin pain at 0745 wrapping around back to LLQ. Eased up, now worsening again. Rates pain 8/10. Denies fever, chills, N/V/D, constipation, loss of appetite, urinary complaints, flank pain, vaginal bleeding or discharge.  Past Medical History  Diagnosis Date  . Ovarian cyst   . HELLP syndrome    Past Surgical History  Procedure Date  . Cesarean section    History   Social History  . Marital Status: Single    Spouse Name: N/A    Number of Children: N/A  . Years of Education: N/A   Occupational History  . Not on file.   Social History Main Topics  . Smoking status: Never Smoker   . Smokeless tobacco: Not on file  . Alcohol Use: No  . Drug Use: No  . Sexually Active: Yes   Other Topics Concern  . Not on file   Social History Narrative  . No narrative on file   No current facility-administered medications on file prior to encounter.   Current Outpatient Prescriptions on File Prior to Encounter  Medication Sig Dispense Refill  . Prenatal Vit-Fe Fumarate-FA (PRENATAL MULTIVITAMIN) TABS Take 1 tablet by mouth daily.      . promethazine (PHENERGAN) 25 MG tablet Take 25 mg by mouth every 6 (six) hours as needed. For nausea.       No Known Allergies  ROS: Pertinent items in HPI  OBJECTIVE Blood pressure 132/69, pulse 80, temperature 98.7 F (37.1 C), temperature source Oral, resp. rate 18, height 5\' 7"  (1.702 m), weight 90.266 kg (199 lb), last menstrual period 07/11/2011.  GENERAL: Well-developed, well-nourished female in moderate distress.  HEENT: Normocephalic, good dentition HEART: normal rate RESP: normal effort ABDOMEN: Soft, RLQ tenderness, no rebound or mass EXTREMITIES: Nontender, no edema NEURO: Alert and oriented SPECULUM EXAM: NEFG,  physiologic discharge, no blood noted, cervix clean BIMANUAL: cervix closed NT; uterus NSSP; no adnexal tenderness or masses   LAB RESULTS Results for orders placed during the hospital encounter of 09/07/11 (from the past 24 hour(s))  URINALYSIS, ROUTINE W REFLEX MICROSCOPIC     Status: Normal   Collection Time   09/07/11  9:00 AM      Component Value Range   Color, Urine YELLOW  YELLOW    APPearance CLEAR  CLEAR    Specific Gravity, Urine 1.015  1.005 - 1.030    pH 8.0  5.0 - 8.0    Glucose, UA NEGATIVE  NEGATIVE (mg/dL)   Hgb urine dipstick NEGATIVE  NEGATIVE    Bilirubin Urine NEGATIVE  NEGATIVE    Ketones, ur NEGATIVE  NEGATIVE (mg/dL)   Protein, ur NEGATIVE  NEGATIVE (mg/dL)   Urobilinogen, UA 0.2  0.0 - 1.0 (mg/dL)   Nitrite NEGATIVE  NEGATIVE    Leukocytes, UA NEGATIVE  NEGATIVE     Clinical Data: Right lower quadrant pain; [redacted] weeks pregnant  RENAL/URINARY TRACT ULTRASOUND COMPLETE  Comparison: None.  Findings:  Right Kidney: Normal in size and parenchymal echogenicity.  Measures 11.0 cm. No evidence of mass or hydronephrosis.  Left Kidney: Normal in size and parenchymal echogenicity.  Measures 11.2 cm. No evidence of mass or hydronephrosis.  Bladder: Appears normal for degree of bladder distention.  Bilateral ureteral jets are demonstrated.  IMPRESSION:  Normal study.  Discussed with Dr. Tobey [name] send home with percocet if pain relieved  IMAGING  ED course: Back pain relieved by Percocet, but RLQ pain persists. Dr. Senaida [name] notified. Ordered pelvic and renal US. Pt got relief of pain with Percocet ASSESSMENT Abdominal pain in pregnancy  PLAN Discharge home Prescription for Percocet F/u for OB appointment     [place], Christina [name] 09/07/2011 10:09 AM

## 2011-09-07 NOTE — Discharge Instructions (Signed)
Abdominal Pain During Pregnancy  Abdominal discomfort is common in pregnancy. Most of the time, it does not cause harm. There are many causes of abdominal pain. Some causes are more serious than others. Some of the causes of abdominal pain in pregnancy are easily diagnosed. Occasionally, the diagnosis takes time to understand. Other times, the cause is not determined. Abdominal pain can be a sign that something is very wrong with the pregnancy, or the pain may have nothing to do with the pregnancy at all. For this reason, always tell your caregiver if you have any abdominal discomfort.  CAUSES  Common and harmless causes of abdominal pain include:   Constipation.   Excess gas and bloating.   Round ligament pain. This is pain that is felt in the folds of the groin.   The position the baby or placenta is in.   Baby kicks.   Braxton-Hicks contractions. These are mild contractions that do not cause cervical dilation.  Serious causes of abdominal pain include:   Ectopic pregnancy. This happens when a fertilized egg implants outside of the uterus.   Miscarriage.   Preterm labor. This is when labor starts at less than 37 weeks of pregnancy.   Placental abruption. This is when the placenta partially or completely separates from the uterus.   Preeclampsia. This is often associated with high blood pressure and has been referred to as "toxemia in pregnancy."   Uterine or amniotic fluid infections.  Causes unrelated to pregnancy include:   Urinary tract infection.   Gallbladder stones or inflammation.   Hepatitis or other liver illness.   Intestinal problems, stomach flu, food poisoning, or ulcer.   Appendicitis.   Kidney (renal) stones.   Kidney infection (pylonephritis).  HOME CARE INSTRUCTIONS   For mild pain:   Do not have sexual intercourse or put anything in your vagina until your symptoms go away completely.   Get plenty of rest until your pain improves. If your pain does not improve in 1 hour, call  your caregiver.   Drink clear fluids if you feel nauseous. Avoid solid food as long as you are uncomfortable or nauseous.   Only take medicine as directed by your caregiver.   Keep all follow-up appointments with your caregiver.  SEEK IMMEDIATE MEDICAL CARE IF:   You are bleeding, leaking fluid, or passing tissue from the vagina.   You have increasing pain or cramping.   You have persistent vomiting.   You have painful or bloody urination.   You have a fever.   You notice a decrease in your baby's movements.   You have extreme weakness or feel faint.   You have shortness of breath, with or without abdominal pain.   You develop a severe headache with abdominal pain.   You have abnormal vaginal discharge with abdominal pain.   You have persistent diarrhea.   You have abdominal pain that continues even after rest, or gets worse.  MAKE SURE YOU:    Understand these instructions.   Will watch your condition.   Will get help right away if you are not doing well or get worse.  Document Released: 04/26/2005 Document Revised: 04/15/2011 Document Reviewed: 11/20/2010  [employer] Patient Information 2012 [employer].

## 2011-09-07 NOTE — MAU Note (Signed)
Pt in c/o bilateral ovarian pain.  States she had a very intense sharp pain in rlq around 0745.  States after that pain her back and left side started hurting.  Pain has decreased in the past 15 minutes.   Pain is now mainly midline down back.  Denies any bleeding or discharge.

## 2011-09-30 ENCOUNTER — Other Ambulatory Visit: Payer: Self-pay

## 2011-10-01 ENCOUNTER — Other Ambulatory Visit (HOSPITAL_COMMUNITY): Payer: Self-pay | Admitting: Obstetrics and Gynecology

## 2011-10-01 DIAGNOSIS — Z3682 Encounter for antenatal screening for nuchal translucency: Secondary | ICD-10-CM

## 2011-10-06 ENCOUNTER — Ambulatory Visit (HOSPITAL_COMMUNITY)
Admission: RE | Admit: 2011-10-06 | Discharge: 2011-10-06 | Disposition: A | Discharge status: Home / Self Care | Payer: Medicaid Other | Source: Ambulatory Visit | Attending: Obstetrics and Gynecology | Admitting: Obstetrics and Gynecology

## 2011-10-06 ENCOUNTER — Other Ambulatory Visit: Payer: Self-pay

## 2011-10-06 DIAGNOSIS — Z3682 Encounter for antenatal screening for nuchal translucency: Secondary | ICD-10-CM

## 2011-10-06 DIAGNOSIS — O351XX0 Maternal care for (suspected) chromosomal abnormality in fetus, not applicable or unspecified: Secondary | ICD-10-CM | POA: Insufficient documentation

## 2011-10-06 DIAGNOSIS — Z3689 Encounter for other specified antenatal screening: Secondary | ICD-10-CM | POA: Insufficient documentation

## 2011-10-06 DIAGNOSIS — O09299 Supervision of pregnancy with other poor reproductive or obstetric history, unspecified trimester: Secondary | ICD-10-CM | POA: Insufficient documentation

## 2011-10-06 DIAGNOSIS — O3510X0 Maternal care for (suspected) chromosomal abnormality in fetus, unspecified, not applicable or unspecified: Secondary | ICD-10-CM | POA: Insufficient documentation

## 2011-10-06 DIAGNOSIS — Z1389 Encounter for screening for other disorder: Secondary | ICD-10-CM

## 2011-10-06 DIAGNOSIS — Z8751 Personal history of pre-term labor: Secondary | ICD-10-CM | POA: Insufficient documentation

## 2011-10-06 DIAGNOSIS — O34219 Maternal care for unspecified type scar from previous cesarean delivery: Secondary | ICD-10-CM | POA: Insufficient documentation

## 2011-10-06 NOTE — Progress Notes (Signed)
Patient seen today  for ultrasound today.  See full report in [facility].  Alpha Gula, MD  Single IUP at 12 3/7 weeks First trimester screen performed.  NT of 1.9 mm noted. Nasal bone was visualized.  Recommend follow up ultrasound for anatomy at approximately 18 weeks.

## 2011-11-12 ENCOUNTER — Encounter (HOSPITAL_COMMUNITY): Payer: Self-pay | Admitting: *Deleted

## 2011-11-12 ENCOUNTER — Inpatient Hospital Stay (HOSPITAL_COMMUNITY)
Admission: AD | Admit: 2011-11-12 | Discharge: 2011-11-13 | Disposition: A | Discharge status: Home / Self Care | Payer: Medicaid Other | Source: Ambulatory Visit | Attending: Obstetrics and Gynecology | Admitting: Obstetrics and Gynecology

## 2011-11-12 DIAGNOSIS — O99891 Other specified diseases and conditions complicating pregnancy: Secondary | ICD-10-CM | POA: Insufficient documentation

## 2011-11-12 DIAGNOSIS — R42 Dizziness and giddiness: Secondary | ICD-10-CM | POA: Insufficient documentation

## 2011-11-12 LAB — CBC
HCT: 34.7 % — ABNORMAL LOW (ref 36.0–46.0)
Hemoglobin: 11.9 g/dL — ABNORMAL LOW (ref 12.0–15.0)
RDW: 12.2 % (ref 11.5–15.5)
WBC: 8.8 10*3/uL (ref 4.0–10.5)

## 2011-11-12 LAB — COMPREHENSIVE METABOLIC PANEL
ALT: 17 U/L (ref 0–35)
Albumin: 3.2 g/dL — ABNORMAL LOW (ref 3.5–5.2)
Alkaline Phosphatase: 55 U/L (ref 39–117)
BUN: 8 mg/dL (ref 6–23)
Chloride: 102 mEq/L (ref 96–112)
Glucose, Bld: 95 mg/dL (ref 70–99)
Potassium: 3.5 mEq/L (ref 3.5–5.1)
Sodium: 134 mEq/L — ABNORMAL LOW (ref 135–145)
Total Bilirubin: 0.1 mg/dL — ABNORMAL LOW (ref 0.3–1.2)

## 2011-11-12 NOTE — MAU Provider Note (Signed)
History     CSN: [id number]  Arrival date and time: 11/12/11 2034   First Provider Initiated Contact with Patient 11/12/11 2218      Chief Complaint  Patient presents with  . Dizziness   HPI This is a 21 y.o. female at [redacted]w[redacted]d who presents with c/o dizziness.  States it is worse when she closes her eyes.  No recent history of sinus congestion. Has never had it happen before except once when she had HELLP syndrome. No other symptoms.  OB History    Grav Para Term Preterm Abortions TAB SAB Ect Mult Living   2 1  1             Past Medical History  Diagnosis Date  . Ovarian cyst   . HELLP syndrome     Past Surgical History  Procedure Date  . Cesarean section     Family History  Problem Relation Age of Onset  . Anesthesia problems Neg Hx   . Other Neg Hx     History  Substance Use Topics  . Smoking status: Never Smoker   . Smokeless tobacco: Not on file  . Alcohol Use: No    Allergies: No Known Allergies  Prescriptions prior to admission  Medication Sig Dispense Refill  . Prenatal Vit-Fe Fumarate-FA (PRENATAL MULTIVITAMIN) TABS Take 1 tablet by mouth daily.      . promethazine (PHENERGAN) 25 MG tablet Take 25 mg by mouth every 6 (six) hours as needed. For nausea.        ROS Denies sore throat or fever.  Other ROS as in HPI  Physical Exam   Blood pressure 116/72, pulse 106, temperature 97.6 F (36.4 C), temperature source Oral, resp. rate 20, height 5' 7.5" (1.715 m), weight 198 lb 6.4 oz (89.994 kg), last menstrual period 07/11/2011.  Physical Exam  Constitutional: She is oriented to person, place, and time. She appears well-developed and well-nourished. No distress.  HENT:  Head: Normocephalic.       Bilateral erethema in ear canals Mod bulging of TMs bilaterally  Neck: Normal range of motion. Neck supple.  Cardiovascular: Normal rate.   Respiratory: Effort normal.  GI: Soft.  Musculoskeletal: Normal range of motion.  Neurological: She is alert and  oriented to person, place, and time.  Skin: Skin is warm and dry.  Psychiatric: She has a normal mood and affect.   Filed Vitals:   11/12/11 2058 11/12/11 2214 11/12/11 2215 11/12/11 2218  BP: 133/70 125/62 126/67 116/72  Pulse: 84 80 82 106  Temp: 99 F (37.2 C)  97.6 F (36.4 C)   TempSrc: Oral     Resp: 20  20   Height: 5' 7.5" (1.715 m)     Weight: 198 lb 6.4 oz (89.994 kg)       MAU Course  Procedures  MDM Discussed with Dr Ambrose Mantle >> CBC and CMET ordered Results for orders placed during the hospital encounter of 11/12/11 (from the past 24 hour(s))  CBC     Status: Abnormal   Collection Time   11/12/11 10:57 PM      Component Value Range   WBC 8.8  4.0 - 10.5 K/uL   RBC 4.20  3.87 - 5.11 MIL/uL   Hemoglobin 11.9 (*) 12.0 - 15.0 g/dL   HCT 40.9 (*) 81.1 - 91.4 %   MCV 82.6  78.0 - 100.0 fL   MCH 28.3  26.0 - 34.0 pg   MCHC 34.3  30.0 - 36.0 g/dL  RDW 12.2  11.5 - 15.5 %   Platelets 198  150 - 400 K/uL  COMPREHENSIVE METABOLIC PANEL     Status: Abnormal   Collection Time   11/12/11 10:57 PM      Component Value Range   Sodium 134 (*) 135 - 145 mEq/L   Potassium 3.5  3.5 - 5.1 mEq/L   Chloride 102  96 - 112 mEq/L   CO2 24  19 - 32 mEq/L   Glucose, Bld 95  70 - 99 mg/dL   BUN 8  6 - 23 mg/dL   Creatinine, Ser 0.98  0.50 - 1.10 mg/dL   Calcium 9.0  8.4 - 11.9 mg/dL   Total Protein 7.1  6.0 - 8.3 g/dL   Albumin 3.2 (*) 3.5 - 5.2 g/dL   AST 17  0 - 37 U/L   ALT 17  0 - 35 U/L   Alkaline Phosphatase 55  39 - 117 U/L   Total Bilirubin 0.1 (*) 0.3 - 1.2 mg/dL   GFR calc non Af Amer >90  >90 mL/min   GFR calc Af Amer >90  >90 mL/min     Assessment and Plan  A:  SIUP at [redacted]w[redacted]d      Dizziness, possibly otitis, vertigo, or dehydration P:  Report given to W. Muhammed.      Pt was later discharge home with meds for vertigo.  Wynelle Bourgeois 11/12/2011, 10:24 PM

## 2011-11-12 NOTE — MAU Note (Signed)
Dr Ambrose Mantle called in and CNM with another pt. Pt's lab results given to Dr Ambrose Mantle. Suggested for CNM could try Dramamine or Meclazine for vertigo. Will let CNM know.

## 2011-11-12 NOTE — MAU Note (Signed)
I've been having really bad dizzy spells. Worse when I close and then open my eyes or lay my head down. Had HELLP with my first pregnancy and had dizzy spells then but usually don't have problems with dizziness

## 2011-11-13 DIAGNOSIS — R42 Dizziness and giddiness: Secondary | ICD-10-CM

## 2011-11-13 MED ORDER — MECLIZINE HCL 32 MG PO TABS: 32.0000 mg | ORAL_TABLET | Freq: Three times a day (TID) | ORAL | Status: AC | PRN

## 2011-11-13 NOTE — Progress Notes (Signed)
Threasa Heads CNM in earlier and reviewed lab results and d/c plan. Written and verbal d/c instructions given and understanding voiced.

## 2011-11-17 ENCOUNTER — Other Ambulatory Visit (HOSPITAL_COMMUNITY): Payer: Medicaid Other

## 2011-12-22 ENCOUNTER — Encounter (HOSPITAL_COMMUNITY): Payer: Self-pay | Admitting: *Deleted

## 2011-12-22 ENCOUNTER — Inpatient Hospital Stay (HOSPITAL_COMMUNITY)
Admission: AD | Admit: 2011-12-22 | Discharge: 2011-12-23 | Disposition: A | Discharge status: Home / Self Care | Payer: Medicaid Other | Source: Ambulatory Visit | Attending: Obstetrics and Gynecology | Admitting: Obstetrics and Gynecology

## 2011-12-22 DIAGNOSIS — N898 Other specified noninflammatory disorders of vagina: Secondary | ICD-10-CM

## 2011-12-22 DIAGNOSIS — N949 Unspecified condition associated with female genital organs and menstrual cycle: Secondary | ICD-10-CM | POA: Insufficient documentation

## 2011-12-22 DIAGNOSIS — O26899 Other specified pregnancy related conditions, unspecified trimester: Secondary | ICD-10-CM

## 2011-12-22 DIAGNOSIS — O99891 Other specified diseases and conditions complicating pregnancy: Secondary | ICD-10-CM | POA: Insufficient documentation

## 2011-12-22 LAB — URINALYSIS, ROUTINE W REFLEX MICROSCOPIC
Bilirubin Urine: NEGATIVE
Glucose, UA: NEGATIVE mg/dL
Ketones, ur: NEGATIVE mg/dL
Leukocytes, UA: NEGATIVE
Protein, ur: NEGATIVE mg/dL

## 2011-12-22 NOTE — MAU Note (Signed)
Pt states she is feeling some cramping and possibly has an infection.

## 2011-12-22 NOTE — MAU Note (Signed)
Pt G2 p0 at 23.3wks having cramping, vaginal discharge and odor.  Denies bleeding or problems with pregnancy.

## 2011-12-22 NOTE — Progress Notes (Deleted)
Pt diagnosed in 2009. Pt states grandmother was diagnosed.

## 2011-12-22 NOTE — Progress Notes (Addendum)
Grandmother diagnosed with lung cancer and 2009 and pt was diagnosed with depression

## 2011-12-22 NOTE — MAU Provider Note (Signed)
History     CSN: [id number]  Arrival date and time: 12/22/11 2250   First Provider Initiated Contact with Patient 12/22/11 2358      Chief Complaint  Patient presents with  . Dysmenorrhea  . Vaginal Discharge   HPI Christina [name] is 20 y.o. G2P0100 [redacted]w[redacted]d weeks presenting with vaginal discharge and odor.  States she was told 2 weeks ago and told she may have an infection but it didn't need treatment.  Saw Dr. Senaida Ores and forgot to tell her.  Denies complications with this pregnancy.  Denies vaginal bleeding, loss of fluid.  + fetal movement.  States she did have blood in her urine and told she had stones several weeks ago.  Denies abdominal pain but cramping last 2 days.    Past Medical History  Diagnosis Date  . Ovarian cyst   . HELLP syndrome     Past Surgical History  Procedure Date  . Cesarean section     Family History  Problem Relation Age of Onset  . Anesthesia problems Neg Hx   . Other Neg Hx   . Thyroid disease Mother   . Hypertension Mother   . Anxiety disorder Mother     History  Substance Use Topics  . Smoking status: Never Smoker   . Smokeless tobacco: Not on file  . Alcohol Use: No    Allergies: No Known Allergies  Prescriptions prior to admission  Medication Sig Dispense Refill  . Prenatal Vit-Fe Fumarate-FA (PRENATAL MULTIVITAMIN) TABS Take 1 tablet by mouth daily.      . promethazine (PHENERGAN) 25 MG tablet Take 25 mg by mouth every 6 (six) hours as needed. For nausea.        Review of Systems  Constitutional: Negative.   HENT: Negative.   Gastrointestinal: Positive for abdominal pain (mild intermittent cramps).  Genitourinary:       Negative for bleeding.  + for vaginal discharge with odor   Physical Exam   Blood pressure 116/66, pulse 85, temperature 97.7 F (36.5 C), temperature source Oral, resp. rate 18, height 5' 7.5" (1.715 m), weight 91.354 kg (201 lb 6.4 oz), last menstrual period 07/11/2011.  Physical Exam    Constitutional: She is oriented to person, place, and time. She appears well-developed and well-nourished. No distress.  HENT:  Head: Normocephalic.  Neck: Normal range of motion.  Cardiovascular: Normal rate.   Respiratory: Effort normal.  GI: Soft. She exhibits no distension and no mass. There is no tenderness. There is no rebound and no guarding.  Genitourinary: Uterus is enlarged (gravid=[redacted]w[redacted]d). Uterus is not tender. No tenderness or bleeding around the vagina. Vaginal discharge: white discharge without odor.  Neurological: She is alert and oriented to person, place, and time.  Skin: Skin is warm and dry.  Psychiatric: She has a normal mood and affect. Her behavior is normal.   Results for orders placed during the hospital encounter of 12/22/11 (from the past 24 hour(s))  URINALYSIS, ROUTINE W REFLEX MICROSCOPIC     Status: Abnormal   Collection Time   12/22/11 11:02 PM      Component Value Range   Color, Urine YELLOW  YELLOW   APPearance CLEAR  CLEAR   Specific Gravity, Urine >1.030 (*) 1.005 - 1.030   pH 6.0  5.0 - 8.0   Glucose, UA NEGATIVE  NEGATIVE mg/dL   Hgb urine dipstick NEGATIVE  NEGATIVE   Bilirubin Urine NEGATIVE  NEGATIVE   Ketones, ur NEGATIVE  NEGATIVE mg/dL   Protein,  ur NEGATIVE  NEGATIVE mg/dL   Urobilinogen, UA 0.2  0.0 - 1.0 mg/dL   Nitrite NEGATIVE  NEGATIVE   Leukocytes, UA NEGATIVE  NEGATIVE  WET PREP, GENITAL     Status: Abnormal   Collection Time   12/23/11 12:13 AM      Component Value Range   Yeast Wet Prep HPF POC NONE SEEN  NONE SEEN   Trich, Wet Prep NONE SEEN  NONE SEEN   Clue Cells Wet Prep HPF POC FEW (*) NONE SEEN   WBC, Wet Prep HPF POC FEW (*) NONE SEEN    FMS-reassuring for gestational age. MAU Course  Procedures  MDM 12:05  Reported MSE to Dr. Senaida Ores.  Order for wet prep and treat.    Assessment and Plan  A:  Vaginal discharge with odor  P:  Rx for Flagyl 500mg  bid X 7 days, avoid intercourse while on medication      Keep  scheduled appointment for continued prenatal care       Lenwood Balsam,EVE M 12/22/2011, 11:59 PM

## 2011-12-23 LAB — WET PREP, GENITAL

## 2011-12-23 MED ORDER — METRONIDAZOLE 500 MG PO TABS: 500.0000 mg | ORAL_TABLET | Freq: Two times a day (BID) | ORAL | Status: AC

## 2011-12-24 ENCOUNTER — Encounter (HOSPITAL_COMMUNITY): Payer: Self-pay | Admitting: *Deleted

## 2011-12-24 ENCOUNTER — Inpatient Hospital Stay (HOSPITAL_COMMUNITY)
Admission: AD | Admit: 2011-12-24 | Discharge: 2011-12-24 | Disposition: A | Discharge status: Home / Self Care | Payer: Medicaid Other | Source: Ambulatory Visit | Attending: Obstetrics and Gynecology | Admitting: Obstetrics and Gynecology

## 2011-12-24 DIAGNOSIS — R109 Unspecified abdominal pain: Secondary | ICD-10-CM

## 2011-12-24 DIAGNOSIS — O219 Vomiting of pregnancy, unspecified: Secondary | ICD-10-CM

## 2011-12-24 DIAGNOSIS — O212 Late vomiting of pregnancy: Secondary | ICD-10-CM | POA: Insufficient documentation

## 2011-12-24 DIAGNOSIS — F410 Panic disorder [episodic paroxysmal anxiety] without agoraphobia: Secondary | ICD-10-CM | POA: Insufficient documentation

## 2011-12-24 DIAGNOSIS — O99340 Other mental disorders complicating pregnancy, unspecified trimester: Secondary | ICD-10-CM | POA: Insufficient documentation

## 2011-12-24 HISTORY — DX: Depression, unspecified: F32.A

## 2011-12-24 HISTORY — DX: Major depressive disorder, single episode, unspecified: F32.9

## 2011-12-24 HISTORY — DX: Anxiety disorder, unspecified: F41.9

## 2011-12-24 LAB — URINALYSIS, ROUTINE W REFLEX MICROSCOPIC
Bilirubin Urine: NEGATIVE
Leukocytes, UA: NEGATIVE
Nitrite: NEGATIVE
Specific Gravity, Urine: 1.025 (ref 1.005–1.030)
Urobilinogen, UA: 1 mg/dL (ref 0.0–1.0)
pH: 6.5 (ref 5.0–8.0)

## 2011-12-24 NOTE — MAU Note (Signed)
Joellyn Haff CNM at bedside with patient

## 2011-12-24 NOTE — MAU Note (Signed)
Pt states, " I was at work and started vomiting. I threw up three times and then I started having pain in my upper abdomen. When the pain started, I felt a panic attack start. I knew what it was because I've had them before."

## 2011-12-24 NOTE — MAU Note (Signed)
Pt presented to [facility] stating that she couldn't breathe and was having a panic attack. Mild hyperventilation noted and patient crying.  She relaxed and was able to calm down quickly.

## 2011-12-24 NOTE — MAU Provider Note (Signed)
History   Christina [name] is a 21 y.o. G106P0101 female at [redacted]w[redacted]d presenting w/ report of nausea this am, and 3 episodes of vomiting after eating alfredo and ravioli while at work, w/  sharp intermittent centralized abdominal pain running from epigastric region down towards umbilicus.  Panic attack began right after pain.  Reports good fm. Denies LOF or VB, fever chills, urinary frequency, dysuria, hematuria, diarrhea, constipation.  Was going to go to office until panic attack began, then decided to come here.  Panic attack, n/v, and abd pain have all resolved. Currently taking Flagyl for BV dx 8/14.  Next appt scheduled for 9/3.   CSN: [id number]  Arrival date and time: 12/24/11 1521   First Provider Initiated Contact with Patient 12/24/11 1606      Chief Complaint  Patient presents with  . Abdominal Pain  . Panic Attack   Abdominal Pain This is a new problem. The current episode started today. The onset quality is sudden. Episode frequency: once. Duration: . The problem has been resolved. Pain location: points to central abdomen from epigastric region down to umbilicus. The pain is at a severity of 0/10 (no pain at present time- was an 8 when present). The patient is experiencing no pain. The quality of the pain is sharp. The abdominal pain does not radiate. Associated symptoms include nausea and vomiting (x 3 this am at work after eating alfredo and ravioli). Pertinent negatives include no constipation, diarrhea, dysuria, frequency or hematuria. Headaches: 'after my panic attack' Associated symptoms comments: Panic attack after n/v and abdominal pain began, resolved now . Nothing aggravates the pain. The pain is relieved by nothing. She has tried nothing for the symptoms. Her past medical history is significant for GERD (occasional tums w/ relief).    OB History    Grav Para Term Preterm Abortions TAB SAB Ect Mult Living   2 1  1      1       Past Medical History  Diagnosis Date  .  Ovarian cyst   . HELLP syndrome   . Depression   . Anxiety     Past Surgical History  Procedure Date  . Cesarean section     Family History  Problem Relation Age of Onset  . Anesthesia problems Neg Hx   . Other Neg Hx   . Thyroid disease Mother   . Hypertension Mother   . Anxiety disorder Mother   . Cancer Maternal Grandmother     History  Substance Use Topics  . Smoking status: Never Smoker   . Smokeless tobacco: Not on file  . Alcohol Use: No    Allergies: No Known Allergies  Prescriptions prior to admission  Medication Sig Dispense Refill  . metroNIDAZOLE (FLAGYL) 500 MG tablet Take 1 tablet (500 mg total) by mouth 2 (two) times daily.  14 tablet  0  . Prenatal Vit-Fe Fumarate-FA (PRENATAL MULTIVITAMIN) TABS Take 1 tablet by mouth daily.        Review of Systems  Constitutional: Negative.   Eyes: Negative.   Respiratory: Negative.   Cardiovascular: Negative.   Gastrointestinal: Positive for heartburn (not at present time), nausea, vomiting (x 3 this am at work after eating alfredo and ravioli) and abdominal pain. Negative for diarrhea and constipation.  Genitourinary: Negative.  Negative for dysuria, frequency and hematuria.  Musculoskeletal: Positive for back pain (lower back pain earlier- none now).  Skin: Negative.   Neurological: Negative.  Headaches: 'after my panic attack'  Endo/Heme/Allergies: Negative.  Psychiatric/Behavioral: Positive for depression. The patient is nervous/anxious (had panic attack after n/v and pain began, resolved now).    Physical Exam   Blood pressure 123/72, pulse 86, temperature 98.7 F (37.1 C), temperature source Oral, resp. rate 20, height 5' 7.5" (1.715 m), weight 91.343 kg (201 lb 6 oz), last menstrual period 07/11/2011, SpO2 100.00%.  Physical Exam  Constitutional: She is oriented to person, place, and time. She appears well-developed and well-nourished.  HENT:  Head: Normocephalic.  Eyes: Pupils are equal, round, and  reactive to light.  Neck: Normal range of motion.  Cardiovascular: Normal rate and regular rhythm.   Respiratory: Effort normal and breath sounds normal.  GI: Soft. Bowel sounds are normal. She exhibits no distension. There is no tenderness.  Musculoskeletal: Normal range of motion.  Neurological: She is alert and oriented to person, place, and time. She has normal reflexes.  Skin: Skin is warm and dry.  Psychiatric: She has a normal mood and affect. Her behavior is normal. Judgment and thought content normal.   FHR: 135, moderate variability, 15x15 accels, no decels=Cat I UCs: mild ui   Results for orders placed during the hospital encounter of 12/24/11 (from the past 24 hour(s))  URINALYSIS, ROUTINE W REFLEX MICROSCOPIC     Status: Abnormal   Collection Time   12/24/11  4:00 PM      Component Value Range   Color, Urine YELLOW  YELLOW   APPearance CLEAR  CLEAR   Specific Gravity, Urine 1.025  1.005 - 1.030   pH 6.5  5.0 - 8.0   Glucose, UA NEGATIVE  NEGATIVE mg/dL   Hgb urine dipstick NEGATIVE  NEGATIVE   Bilirubin Urine NEGATIVE  NEGATIVE   Ketones, ur >80 (*) NEGATIVE mg/dL   Protein, ur NEGATIVE  NEGATIVE mg/dL   Urobilinogen, UA 1.0  0.0 - 1.0 mg/dL   Nitrite NEGATIVE  NEGATIVE   Leukocytes, UA NEGATIVE  NEGATIVE    MAU Course  Procedures UA  1635: Notified Dr. Ambrose [name] of pt, OK to d/c home w/o any further workup   Christina, [name]  Home Medication Instructions AVW:[id number]   Printed on:12/24/11 1649  Medication Information                    Prenatal Vit-Fe Fumarate-FA (PRENATAL MULTIVITAMIN) TABS Take 1 tablet by mouth daily.           metroNIDAZOLE (FLAGYL) 500 MG tablet Take 1 tablet (500 mg total) by mouth 2 (two) times daily.              Follow-up Information    Follow up with Christina Plume, MD on 01/11/2012. (as scheduled. Return to hospital  as needed or  if symptoms worsen)    Contact information:   [employer] [address] [place], [address] [place] [address] [phone]           Assessment and Plan  A:   [redacted]w[redacted]d SIUP  Cat I FHR  Isolated episode of n/v, abdominal pain, and panic attack during pregnancy- now resolved Ketonuria   P:  D/C home  Push po fluids  Keep appt at [facility] on 9/3 as scheduled  Marge Duncans, CNM 12/24/2011, 4:11 PM

## 2012-01-23 ENCOUNTER — Inpatient Hospital Stay (HOSPITAL_COMMUNITY)
Admission: AD | Admit: 2012-01-23 | Discharge: 2012-01-23 | Disposition: A | Discharge status: Home / Self Care | Payer: Medicaid Other | Source: Ambulatory Visit | Attending: Obstetrics and Gynecology | Admitting: Obstetrics and Gynecology

## 2012-01-23 ENCOUNTER — Encounter (HOSPITAL_COMMUNITY): Payer: Self-pay

## 2012-01-23 DIAGNOSIS — B373 Candidiasis of vulva and vagina: Secondary | ICD-10-CM

## 2012-01-23 DIAGNOSIS — N949 Unspecified condition associated with female genital organs and menstrual cycle: Secondary | ICD-10-CM | POA: Insufficient documentation

## 2012-01-23 DIAGNOSIS — B3731 Acute candidiasis of vulva and vagina: Secondary | ICD-10-CM | POA: Insufficient documentation

## 2012-01-23 DIAGNOSIS — L293 Anogenital pruritus, unspecified: Secondary | ICD-10-CM | POA: Insufficient documentation

## 2012-01-23 HISTORY — DX: Gestational (pregnancy-induced) hypertension without significant proteinuria, unspecified trimester: O13.9

## 2012-01-23 LAB — POCT FERN TEST: Fern Test: NEGATIVE

## 2012-01-23 LAB — URINALYSIS, ROUTINE W REFLEX MICROSCOPIC
Nitrite: NEGATIVE
Protein, ur: NEGATIVE mg/dL
Specific Gravity, Urine: 1.015 (ref 1.005–1.030)
Urobilinogen, UA: 2 mg/dL — ABNORMAL HIGH (ref 0.0–1.0)

## 2012-01-23 LAB — WET PREP, GENITAL
Trich, Wet Prep: NONE SEEN
Yeast Wet Prep HPF POC: NONE SEEN

## 2012-01-23 MED ORDER — FLUCONAZOLE 150 MG PO TABS: 150.0000 mg | ORAL_TABLET | Freq: Once | ORAL | Status: AC

## 2012-01-23 MED ORDER — FLUCONAZOLE 150 MG PO TABS
150.0000 mg | ORAL_TABLET | Freq: Once | ORAL | Status: AC
  Administered 2012-01-23: 150 mg via ORAL
  Filled 2012-01-23: qty 1

## 2012-01-23 NOTE — MAU Note (Signed)
Pt reports "i have been having a lot of discharge", states had gush of watery fluid at 1930, other times the discharge has been thick and also has vaginal itching.

## 2012-01-23 NOTE — MAU Provider Note (Signed)
Chief Complaint:  Rupture of Membranes and Vaginal Discharge   First Provider Initiated Contact with Patient 01/23/12 2244      HPI: Christina [name] is a 21 y.o. G2P0101 at [redacted]w[redacted]d who presents to maternity admissions reporting thick white vaginal discharge and itching.  She had an episode of clear fluid leakage, enough to wet her underwear but did not require a pad approximately 3 hours ago.  She has not had leakage since that time.  She reports good fetal movement, denies contractions/cramping, vaginal bleeding, urinary symptoms, h/a, dizziness, n/v, or fever/chills.     Pregnancy Course:   Past Medical History: Past Medical History  Diagnosis Date  . Ovarian cyst   . HELLP syndrome   . Depression   . Pregnancy induced hypertension     HELLP syndrome with first pregnancy  . Anxiety     hx of panic attacks     Past Surgical History: Past Surgical History  Procedure Date  . Cesarean section     Family History: Family History  Problem Relation Age of Onset  . Anesthesia problems Neg Hx   . Other Neg Hx   . Thyroid disease Mother   . Hypertension Mother   . Anxiety disorder Mother   . Cancer Maternal Grandmother     Social History: History  Substance Use Topics  . Smoking status: Never Smoker   . Smokeless tobacco: Not on file  . Alcohol Use: No    Allergies: No Known Allergies  Meds:  Prescriptions prior to admission  Medication Sig Dispense Refill  . flintstones complete (FLINTSTONES) 60 MG chewable tablet Chew 1 tablet by mouth daily.      . Prenatal Vit-Fe Fumarate-FA (PRENATAL MULTIVITAMIN) TABS Take 1 tablet by mouth daily.        ROS: Pertinent findings in history of present illness.  Physical Exam  Blood pressure 135/72, pulse 79, temperature 98.6 F (37 C), temperature source Oral, resp. rate 18, height 5' 7.5" (1.715 m), weight 91.627 kg (202 lb), last menstrual period 07/11/2011, SpO2 100.00%. GENERAL: Well-developed, well-nourished female in no  acute distress.  HEENT: normocephalic HEART: normal rate RESP: normal effort ABDOMEN: Soft, non-tender, gravid appropriate for gestational age EXTREMITIES: Nontender, no edema NEURO: alert and oriented Pelvic exam: Cervix pink, visually closed, without lesion, large amount of thick, clumpy white discharge clinging to vaginal walls, vaginal walls with mild erythema, external genitalia normal but with white discharge noted in folds of labia  Dilation: Closed Effacement (%): Thick Station: -3 Exam by:: L Leftwich Kirby CNM  Ferning negative Wet prep collected  FHT:  Baseline 135 , moderate variability, accelerations present, no decelerations Contractions: None   Labs: Results for orders placed during the hospital encounter of 01/23/12 (from the past 24 hour(s))  URINALYSIS, ROUTINE W REFLEX MICROSCOPIC     Status: Abnormal   Collection Time   01/23/12 10:22 PM      Component Value Range   Color, Urine YELLOW  YELLOW   APPearance CLEAR  CLEAR   Specific Gravity, Urine 1.015  1.005 - 1.030   pH 8.5 (*) 5.0 - 8.0   Glucose, UA NEGATIVE  NEGATIVE mg/dL   Hgb urine dipstick NEGATIVE  NEGATIVE   Bilirubin Urine NEGATIVE  NEGATIVE   Ketones, ur NEGATIVE  NEGATIVE mg/dL   Protein, ur NEGATIVE  NEGATIVE mg/dL   Urobilinogen, UA 2.0 (*) 0.0 - 1.0 mg/dL   Nitrite NEGATIVE  NEGATIVE   Leukocytes, UA NEGATIVE  NEGATIVE    Assessment:  1. Candidal vaginitis     Plan: Discharge home Reviewed Labor precautions and fetal kick counts Diflucan x1 dose in MAU, then 1 dose in 48 hours prescribed (oral antifungal selected r/t pt concern over discharge and being unable to tell if she has ruptured membranes if she uses topical medication) F/U with Dr Ambrose Mantle Return to MAU as needed    Medication List     As of 01/23/2012 10:54 PM    ASK your doctor about these medications         flintstones complete 60 MG chewable tablet   Chew 1 tablet by mouth daily.      prenatal multivitamin Tabs     Take 1 tablet by mouth daily.        Sharen Counter Certified Nurse-Midwife 01/23/2012 10:54 PM

## 2012-02-19 ENCOUNTER — Encounter (HOSPITAL_COMMUNITY): Payer: Self-pay

## 2012-02-19 ENCOUNTER — Inpatient Hospital Stay (HOSPITAL_COMMUNITY)
Admission: AD | Admit: 2012-02-19 | Discharge: 2012-02-19 | Disposition: A | Discharge status: Home / Self Care | Payer: Medicaid Other | Source: Ambulatory Visit | Attending: Obstetrics and Gynecology | Admitting: Obstetrics and Gynecology

## 2012-02-19 DIAGNOSIS — O479 False labor, unspecified: Secondary | ICD-10-CM

## 2012-02-19 DIAGNOSIS — R109 Unspecified abdominal pain: Secondary | ICD-10-CM | POA: Insufficient documentation

## 2012-02-19 DIAGNOSIS — O2390 Unspecified genitourinary tract infection in pregnancy, unspecified trimester: Secondary | ICD-10-CM | POA: Insufficient documentation

## 2012-02-19 DIAGNOSIS — N949 Unspecified condition associated with female genital organs and menstrual cycle: Secondary | ICD-10-CM | POA: Insufficient documentation

## 2012-02-19 DIAGNOSIS — B373 Candidiasis of vulva and vagina: Secondary | ICD-10-CM | POA: Insufficient documentation

## 2012-02-19 DIAGNOSIS — O4700 False labor before 37 completed weeks of gestation, unspecified trimester: Secondary | ICD-10-CM | POA: Insufficient documentation

## 2012-02-19 DIAGNOSIS — B3731 Acute candidiasis of vulva and vagina: Secondary | ICD-10-CM | POA: Insufficient documentation

## 2012-02-19 LAB — URINALYSIS, ROUTINE W REFLEX MICROSCOPIC
Bilirubin Urine: NEGATIVE
Glucose, UA: NEGATIVE mg/dL
Ketones, ur: NEGATIVE mg/dL
Leukocytes, UA: NEGATIVE
Nitrite: NEGATIVE
Specific Gravity, Urine: 1.01 (ref 1.005–1.030)
pH: 8 (ref 5.0–8.0)

## 2012-02-19 LAB — WET PREP, GENITAL
Clue Cells Wet Prep HPF POC: NONE SEEN
Trich, Wet Prep: NONE SEEN

## 2012-02-19 MED ORDER — TERBUTALINE SULFATE 1 MG/ML IJ SOLN
0.2500 mg | Freq: Once | INTRAMUSCULAR | Status: AC
  Administered 2012-02-19: 0.25 mg via SUBCUTANEOUS
  Filled 2012-02-19: qty 1

## 2012-02-19 MED ORDER — FLUCONAZOLE 150 MG PO TABS: 150.0000 mg | ORAL_TABLET | Freq: Once | ORAL | Status: DC

## 2012-02-19 NOTE — MAU Note (Signed)
I've been having a lot of pelvic pressure and cramping for past hour. No vag bleeding or d/c

## 2012-02-19 NOTE — MAU Provider Note (Signed)
Chief Complaint:  Pelvic Pain, Abdominal Cramping and Contractions   First Provider Initiated Contact with Patient 02/19/12 0158     HPI: Christina [name] is a 21 y.o. G2P0101 at 82w6dwho presents to maternity admissions reporting pelvic pressure and cramping for past hour. Pressure is constant, like menstrual cramps. Worse w/ mvmt. No distinct contractions. Also reports vaginal irritation and frequent yeast infections this pregnancy. Denies leakage of fluid or vaginal bleeding. Good fetal movement. Hx C/S for HELLP syndrome at 33 weeks w/ prev pregnancy.   Past Medical History: Past Medical History  Diagnosis Date  . Ovarian cyst   . HELLP syndrome   . Depression   . Pregnancy induced hypertension     HELLP syndrome with first pregnancy  . Anxiety     hx of panic attacks    Past obstetric history: OB History    Grav Para Term Preterm Abortions TAB SAB Ect Mult Living   2 1  1      1      # Outc Date GA Lbr Len/2nd Wgt Sex Del Anes PTL Lv   1 PRE     M LTCS   Yes   Comments: HELLP syndrome   2 CUR               Past Surgical History: Past Surgical History  Procedure Date  . Cesarean section     Family History: Family History  Problem Relation Age of Onset  . Anesthesia problems Neg Hx   . Other Neg Hx   . Thyroid disease Mother   . Hypertension Mother   . Anxiety disorder Mother   . Cancer Maternal Grandmother     Social History: History  Substance Use Topics  . Smoking status: Never Smoker   . Smokeless tobacco: Not on file  . Alcohol Use: No    Allergies: No Known Allergies  Meds:  Prescriptions prior to admission  Medication Sig Dispense Refill  . flintstones complete (FLINTSTONES) 60 MG chewable tablet Chew 1 tablet by mouth daily.      . Prenatal Vit-Fe Fumarate-FA (PRENATAL MULTIVITAMIN) TABS Take 1 tablet by mouth daily.        ROS: Pertinent findings in history of present illness.  Physical Exam  Blood pressure 127/71, pulse 108, temperature  97.8 F (36.6 C), temperature source Oral, resp. rate 18, height 5\' 7"  (1.702 m), weight 94.62 kg (208 lb 9.6 oz), last menstrual period 07/11/2011. GENERAL: Well-developed, well-nourished female in no acute distress.  HEENT: normocephalic HEART: normal rate RESP: normal effort ABDOMEN: Soft, non-tender, gravid appropriate for gestational age EXTREMITIES: Nontender, no edema NEURO: alert and oriented SPECULUM EXAM: NEFG, moderate amount of clumpy, white, odorless discharge, no blood, cervix clean Dilation: Closed Effacement (%):  (long) Exam by:: v. Bessie Boyte cnm  FHT:  Baseline 140 , moderate variability, accelerations present, no decelerations Contractions: initially UI traced. Toco adjusted. UC's Q 2-6, mild   Labs: Results for orders placed during the hospital encounter of 02/19/12 (from the past 24 hour(s))  URINALYSIS, ROUTINE W REFLEX MICROSCOPIC     Status: Normal   Collection Time   02/19/12  1:20 AM      Component Value Range   Color, Urine YELLOW  YELLOW   APPearance CLEAR  CLEAR   Specific Gravity, Urine 1.010  1.005 - 1.030   pH 8.0  5.0 - 8.0   Glucose, UA NEGATIVE  NEGATIVE mg/dL   Hgb urine dipstick NEGATIVE  NEGATIVE   Bilirubin Urine NEGATIVE  NEGATIVE   Ketones, ur NEGATIVE  NEGATIVE mg/dL   Protein, ur NEGATIVE  NEGATIVE mg/dL   Urobilinogen, UA 0.2  0.0 - 1.0 mg/dL   Nitrite NEGATIVE  NEGATIVE   Leukocytes, UA NEGATIVE  NEGATIVE  WET PREP, GENITAL     Status: Abnormal   Collection Time   02/19/12  2:13 AM      Component Value Range   Yeast Wet Prep HPF POC NONE SEEN  NONE SEEN   Trich, Wet Prep NONE SEEN  NONE SEEN   Clue Cells Wet Prep HPF POC NONE SEEN  NONE SEEN   WBC, Wet Prep HPF POC FEW (*) NONE SEEN    Imaging:  No results found. ED Course UC's resolved w/ Terb. No cervical change.  Assessment: 1. Preterm contractions   2. Round ligament pain   3. Vulvovaginal candidiasis, clinical Dx.     Plan: Discharge home Preterm labor precautions  and fetal kick counts. Comfort measures.      Follow-up Information    Follow up with Bing Plume, MD. (as scheduled)    Contact information:   [facility] [address], [address] [place] [address] [phone]       Follow up with [facility]. (As needed if symptoms worsen)    Contact information:   [address] [id number] mc [place] [address] [phone]          Medication List     As of 02/19/2012  3:53 AM    TAKE these medications         flintstones complete 60 MG chewable tablet   Chew 1 tablet by mouth daily.      fluconazole 150 MG tablet   Commonly known as: DIFLUCAN   Take 1 tablet (150 mg total) by mouth once. May repeat in 72 hours if no relief.      prenatal multivitamin Tabs   Take 1 tablet by mouth daily.          [place], CNM 02/19/2012 3:53 AM

## 2012-02-19 NOTE — MAU Note (Signed)
Patient is in with c/o cramping, possible contractions and constant pelvic pressure, which all started after a verbal argument with her brother. She states that she had some mild contractions 2 days. She denies vaginal bleeding or lof. Reports good fetal movement. She states that she felt stressed after the incident, accompanied by headache and mild upper abdominal ache.

## 2012-02-23 ENCOUNTER — Inpatient Hospital Stay (HOSPITAL_COMMUNITY)
Admission: AD | Admit: 2012-02-23 | Discharge: 2012-02-23 | Disposition: A | Discharge status: Home / Self Care | Payer: Medicaid Other | Source: Ambulatory Visit | Attending: Obstetrics and Gynecology | Admitting: Obstetrics and Gynecology

## 2012-02-23 ENCOUNTER — Inpatient Hospital Stay (HOSPITAL_COMMUNITY): Payer: Medicaid Other

## 2012-02-23 ENCOUNTER — Encounter (HOSPITAL_COMMUNITY): Payer: Self-pay | Admitting: *Deleted

## 2012-02-23 DIAGNOSIS — R109 Unspecified abdominal pain: Secondary | ICD-10-CM | POA: Insufficient documentation

## 2012-02-23 DIAGNOSIS — O99891 Other specified diseases and conditions complicating pregnancy: Secondary | ICD-10-CM | POA: Insufficient documentation

## 2012-02-23 DIAGNOSIS — M549 Dorsalgia, unspecified: Secondary | ICD-10-CM | POA: Insufficient documentation

## 2012-02-23 LAB — URINALYSIS, ROUTINE W REFLEX MICROSCOPIC
Bilirubin Urine: NEGATIVE
Hgb urine dipstick: NEGATIVE
Nitrite: NEGATIVE
Protein, ur: NEGATIVE mg/dL
Specific Gravity, Urine: 1.02 (ref 1.005–1.030)
Urobilinogen, UA: 0.2 mg/dL (ref 0.0–1.0)

## 2012-02-23 LAB — COMPREHENSIVE METABOLIC PANEL
AST: 16 U/L (ref 0–37)
CO2: 24 mEq/L (ref 19–32)
Chloride: 102 mEq/L (ref 96–112)
Creatinine, Ser: 0.55 mg/dL (ref 0.50–1.10)
GFR calc non Af Amer: >90 mL/min (ref >90)
Glucose, Bld: 90 mg/dL (ref 70–99)
Total Bilirubin: 0.1 mg/dL — ABNORMAL LOW (ref 0.3–1.2)

## 2012-02-23 LAB — CBC WITH DIFFERENTIAL/PLATELET
Basophils Absolute: 0 10*3/uL (ref 0.0–0.1)
HCT: 34.5 % — ABNORMAL LOW (ref 36.0–46.0)
Hemoglobin: 11.6 g/dL — ABNORMAL LOW (ref 12.0–15.0)
Lymphocytes Relative: 23 % (ref 12–46)
Lymphs Abs: 1.5 10*3/uL (ref 0.7–4.0)
Monocytes Absolute: 0.6 10*3/uL (ref 0.1–1.0)
Monocytes Relative: 9 % (ref 3–12)
Neutro Abs: 4.3 10*3/uL (ref 1.7–7.7)
RBC: 4.27 MIL/uL (ref 3.87–5.11)
WBC: 6.4 10*3/uL (ref 4.0–10.5)

## 2012-02-23 LAB — AMYLASE: Amylase: 80 U/L (ref 0–105)

## 2012-02-23 NOTE — MAU Provider Note (Signed)
History     CSN: [id number]  Arrival date and time: 02/23/12 0441   First Provider Initiated Contact with Patient 02/23/12 0526      No chief complaint on file.  HPI Christina [name] is a 21 y.o. female @ [redacted]w[redacted]d gestation who presents to MAU with back and abdominal pain. The pain started approximately 12:30 am. The pain started on the right side of abdomen and radiated to the right side of the back. Took tylenol and Rolaids and went back to sleep but woke up around 4 am with increased pain. The history was provided by the patient.  OB History    Grav Para Term Preterm Abortions TAB SAB Ect Mult Living   2 1  1      1       Past Medical History  Diagnosis Date  . Ovarian cyst   . HELLP syndrome   . Depression   . Pregnancy induced hypertension     HELLP syndrome with first pregnancy  . Anxiety     hx of panic attacks    Past Surgical History  Procedure Date  . Cesarean section     Family History  Problem Relation Age of Onset  . Anesthesia problems Neg Hx   . Other Neg Hx   . Thyroid disease Mother   . Hypertension Mother   . Anxiety disorder Mother   . Cancer Maternal Grandmother     History  Substance Use Topics  . Smoking status: Never Smoker   . Smokeless tobacco: Not on file  . Alcohol Use: No    Allergies: No Known Allergies  No prescriptions prior to admission    Review of Systems  Constitutional: Negative for fever, chills and weight loss.  HENT: Negative for ear pain, nosebleeds, congestion, sore throat and neck pain.   Eyes: Negative for blurred vision, double vision, photophobia and pain.  Respiratory: Positive for shortness of breath. Negative for cough and wheezing.   Cardiovascular: Negative for chest pain, palpitations and leg swelling.  Gastrointestinal: Positive for heartburn and abdominal pain. Negative for nausea, vomiting, diarrhea and constipation.  Genitourinary: Positive for frequency. Negative for dysuria and urgency.    Musculoskeletal: Positive for back pain. Negative for myalgias.  Skin: Negative for itching and rash.  Neurological: Negative for dizziness, sensory change, speech change, seizures, weakness and headaches.  Endo/Heme/Allergies: Does not bruise/bleed easily.  Psychiatric/Behavioral: Negative for depression. The patient is not nervous/anxious and does not have insomnia.        Hx of anxiety and depression but no medication and doing well.   Blood pressure 120/69, pulse 83, temperature 98.1 F (36.7 C), temperature source Oral, resp. rate 18, height 5\' 6"  (1.676 m), weight 208 lb (94.348 kg), last menstrual period 07/11/2011.  Physical Exam  Nursing note and vitals reviewed. Constitutional: She is oriented to person, place, and time. She appears well-developed and well-nourished. No distress.  HENT:  Head: Normocephalic and atraumatic.  Eyes: EOM are normal.  Neck: Neck supple.  Cardiovascular: Normal rate.   Respiratory: Effort normal.  GI: Soft. There is tenderness in the right upper quadrant. There is no rigidity, no rebound, no guarding and no CVA tenderness.  Genitourinary: Vagina normal.  Musculoskeletal: Normal range of motion.  Neurological: She is alert and oriented to person, place, and time.  Skin: Skin is warm and dry.  Psychiatric: She has a normal mood and affect. Her behavior is normal. Judgment and thought content normal.   Medical screening exam complete  and RN will call results to Dr. Jackelyn Knife when everything is back. Will continue EFM.   MAU Course: Discussed with Dr. Jackelyn Knife and will do labs and ultrasound for possible cholecystitis   Procedures  Romain Erion, RN, FNP, [facility] 02/24/2012, 12:01 AM

## 2012-02-23 NOTE — MAU Note (Signed)
PT SAYS  LAYED DOWN AT 0030- HAD BAD PAINS  UNDER R BREAST THAT RADIATED DOWN  TO HIP- WONDERED IF IT WAS HELP SYNDROME - BUT PAIN WENT AWAY.Marland Kitchen       CALLED DR  AT 0100- TOLD TO TAKE ANTACIDS AND TYLENOL- SHE DID.    PAIN WAS GOING AWAY.   WENT TO SLEEP- AWOKE AT 0400-  WITH EVERYTHING HURTING -   UPPER ABD AND RADIATES TO BACK .  WHEN SHE VOIDS-VOIDS A LOT.  DENIES HSV AND MRSA.     WAS IN MAU 3 WEEKS AGO-   AND AGAIN- HAD YEAST...   VE - CLOSED.   - HAD TERB.

## 2012-03-07 ENCOUNTER — Encounter (HOSPITAL_COMMUNITY): Payer: Self-pay | Admitting: Family

## 2012-03-07 ENCOUNTER — Inpatient Hospital Stay (HOSPITAL_COMMUNITY)
Admission: AD | Admit: 2012-03-07 | Discharge: 2012-03-07 | Disposition: A | Discharge status: Home / Self Care | Payer: Medicaid Other | Source: Ambulatory Visit | Attending: Obstetrics and Gynecology | Admitting: Obstetrics and Gynecology

## 2012-03-07 DIAGNOSIS — R109 Unspecified abdominal pain: Secondary | ICD-10-CM | POA: Insufficient documentation

## 2012-03-07 DIAGNOSIS — O4700 False labor before 37 completed weeks of gestation, unspecified trimester: Secondary | ICD-10-CM | POA: Insufficient documentation

## 2012-03-07 DIAGNOSIS — O4702 False labor before 37 completed weeks of gestation, second trimester: Secondary | ICD-10-CM

## 2012-03-07 HISTORY — DX: HELLP syndrome (HELLP), third trimester: O14.23

## 2012-03-07 LAB — URINALYSIS, ROUTINE W REFLEX MICROSCOPIC
Bilirubin Urine: NEGATIVE
Glucose, UA: NEGATIVE mg/dL
Ketones, ur: NEGATIVE mg/dL
Nitrite: NEGATIVE
pH: 6.5 (ref 5.0–8.0)

## 2012-03-07 NOTE — MAU Note (Signed)
Pt states upper abd pain since 2300 last pm. Unsure if related to ctx's, has had BH ctx's prior to today's visit. Denies uti s/s.

## 2012-03-07 NOTE — MAU Note (Signed)
Pt reports bilateral upper abdominal pain began last night at 2300; waking through sleep with pain. Continues now; intermittent.

## 2012-03-07 NOTE — MAU Provider Note (Signed)
Chief Complaint:  Abdominal Pain   First Provider Initiated Contact with Patient 03/07/12 0907      HPI: Christina [name] is a 21 y.o. G2P0101 at [redacted]w[redacted]d who presents to maternity admissions reporting intermittent upper abdominal pain since 2300 last night. Describes tightening that is every few to several minutes lasting 10-30 seconds. Denies dysuria, frequency or urgency of urination, and hematuria. Has had issues with persistent yeast vaginitis but currently much better. Denies leakage of fluid or vaginal bleeding. Good fetal movement.   Pregnancy Course: essentially uncomplicated  Past Medical History: Past Medical History  Diagnosis Date  . Ovarian cyst   . HELLP syndrome   . Depression   . Pregnancy induced hypertension     HELLP syndrome with first pregnancy  . Anxiety     hx of panic attacks  . HELLP syndrome in third trimester 2011    Past obstetric history: OB History    Grav Para Term Preterm Abortions TAB SAB Ect Mult Living   2 1  1      1      # Outc Date GA Lbr Len/2nd Wgt Sex Del Anes PTL Lv   1 PRE     M LTCS   Yes   Comments: HELLP syndrome   2 CUR             IOL at 33 wks for HELLP  Past Surgical History: Past Surgical History  Procedure Date  . Cesarean section     Family History: Family History  Problem Relation Age of Onset  . Anesthesia problems Neg Hx   . Other Neg Hx   . Thyroid disease Mother   . Hypertension Mother   . Anxiety disorder Mother   . Cancer Maternal Grandmother     Social History: History  Substance Use Topics  . Smoking status: Never Smoker   . Smokeless tobacco: Not on file  . Alcohol Use: No    Allergies: No Known Allergies  Meds:  No prescriptions prior to admission    ROS: Pertinent findings in history of present illness.  Physical Exam  Blood pressure 115/74, pulse 111, temperature 98 F (36.7 C), temperature source Oral, resp. rate 18, height 5' 7.5" (1.715 m), weight 95.437 kg (210 lb 6.4 oz), last  menstrual period 07/11/2011. GENERAL: Well-developed, well-nourished female in no acute distress.  HEENT: normocephalic HEART: normal rate RESP: normal effort ABDOMEN: Soft, non-tender, gravid appropriate for gestational age, no palpable UC EXTREMITIES: Nontender, no edema NEURO: alert and oriented SPECULUM EXAM: NEFG, physiologic discharge, no blood, cervix clean  Cx post/ long/ closed cephalic  FHT:  Baseline 130 , moderate variability, accelerations present, no decelerations Contractions: irregular, mild   Labs: No results found for this or any previous visit (from the past 24 hour(s)).  Imaging:  US Abdomen Complete  02/23/2012  *RADIOLOGY REPORT*  Clinical Data:  Back pain abdominal pain.  [redacted] weeks pregnant.  COMPLETE ABDOMINAL ULTRASOUND  Comparison:  None.  Findings:  Gallbladder:  No gallstones, gallbladder wall thickening, or pericholecystic fluid.  Common bile duct:  Normal caliber with measured diameter of 2.7 mm.  Liver:  No focal lesion identified.  Within normal limits in parenchymal echogenicity.  IVC:  Appears normal.  Pancreas:  The pancreas is mostly obscured by overlying bowel gas and is not well visualized.  Spleen:  Spleen length measures 7 cm.  Normal parenchymal echotexture.  Right Kidney:  Right kidney measures 9.7 cm length.  No hydronephrosis.  Left Kidney:  Left kidney measures 10.9 cm length.  No hydronephrosis.  Abdominal aorta:  Limited visualization.  No aneurysm identified in the visualized upper abdominal aorta.  IMPRESSION: Negative abdominal ultrasound.   Original Report Authenticated By: Marlon Pel, M.D.    MAU Course:   Assessment: 1. False labor before 37 completed weeks of gestation in second trimester   G2P0101 at [redacted]w[redacted]d Category 1 FHR  Plan: Dr. Jackelyn Knife notified of status Discharge home with rest and increased fluids Preterm labor precautions and fetal kick counts F/U as scheduled with Dr. Beatrice Lecher,  CNM 03/07/2012 9:20 AM

## 2012-03-08 NOTE — OR Nursing (Signed)
Posted for c/s but wants to try vag delivery holding spot per Bonita Quin

## 2012-03-08 NOTE — Progress Notes (Signed)
FHT from 10-29 reviewed.  Reactive NST, no significant decels, irreg ctx

## 2012-03-15 ENCOUNTER — Inpatient Hospital Stay (HOSPITAL_COMMUNITY)
Admission: AD | Admit: 2012-03-15 | Discharge: 2012-03-16 | Disposition: A | Discharge status: Home / Self Care | Payer: Medicaid Other | Source: Ambulatory Visit | Attending: Obstetrics and Gynecology | Admitting: Obstetrics and Gynecology

## 2012-03-15 ENCOUNTER — Encounter (HOSPITAL_COMMUNITY): Payer: Self-pay | Admitting: *Deleted

## 2012-03-15 DIAGNOSIS — O4700 False labor before 37 completed weeks of gestation, unspecified trimester: Secondary | ICD-10-CM | POA: Insufficient documentation

## 2012-03-15 NOTE — MAU Note (Signed)
PT SAYS SHE HAS BEEN HAVING UC-- STARTED AT 630PM-     THEN CLOSER TOGETHER-   STARTED FEELING PRESSURE AT 630PM.   IN OFFICE - LAST Friday- VE- CLOSED.     DENIES HSV AND MRSA.

## 2012-03-15 NOTE — MAU Note (Signed)
Contractions, prior c/s, plans vaginal delivery

## 2012-03-16 ENCOUNTER — Inpatient Hospital Stay (EMERGENCY_DEPARTMENT_HOSPITAL)
Admission: AD | Admit: 2012-03-16 | Discharge: 2012-03-16 | Disposition: A | Discharge status: Home / Self Care | Payer: Medicaid Other | Source: Ambulatory Visit | Attending: Obstetrics and Gynecology | Admitting: Obstetrics and Gynecology

## 2012-03-16 ENCOUNTER — Encounter (HOSPITAL_COMMUNITY): Payer: Self-pay | Admitting: *Deleted

## 2012-03-16 DIAGNOSIS — O26899 Other specified pregnancy related conditions, unspecified trimester: Secondary | ICD-10-CM | POA: Diagnosis present

## 2012-03-16 DIAGNOSIS — O99891 Other specified diseases and conditions complicating pregnancy: Secondary | ICD-10-CM | POA: Insufficient documentation

## 2012-03-16 DIAGNOSIS — R51 Headache: Secondary | ICD-10-CM | POA: Insufficient documentation

## 2012-03-16 HISTORY — DX: Headache: R51

## 2012-03-16 LAB — URINALYSIS, ROUTINE W REFLEX MICROSCOPIC
Bilirubin Urine: NEGATIVE
Hgb urine dipstick: NEGATIVE
Specific Gravity, Urine: 1.02 (ref 1.005–1.030)
pH: 7 (ref 5.0–8.0)

## 2012-03-16 MED ORDER — OXYCODONE-ACETAMINOPHEN 5-325 MG PO TABS
1.0000 | ORAL_TABLET | Freq: Once | ORAL | Status: AC
  Administered 2012-03-16: 1 via ORAL
  Filled 2012-03-16: qty 1

## 2012-03-16 NOTE — MAU Provider Note (Signed)
Chief Complaint:  No chief complaint on file.   None     HPI: Christina [name] is a 21 y.o. G2P0101 at 60w1dwho presents to maternity admissions reporting h/a x1 day that is not improved with Tylenol.  She reports good fetal movement, denies epigastric pain, visual disturbances, LOF, vaginal bleeding, vaginal itching/burning, urinary symptoms, dizziness, n/v, or fever/chills.     Past Medical History: Past Medical History  Diagnosis Date  . Ovarian cyst   . HELLP syndrome   . Depression   . Pregnancy induced hypertension     HELLP syndrome with first pregnancy  . Anxiety     hx of panic attacks  . HELLP syndrome in third trimester 2011  . Headache     \ Past Surgical History: Past Surgical History  Procedure Date  . Cesarean section     Family History: Family History  Problem Relation Age of Onset  . Anesthesia problems Neg Hx   . Other Neg Hx   . Thyroid disease Mother   . Hypertension Mother   . Anxiety disorder Mother   . Cancer Maternal Grandmother     Social History: History  Substance Use Topics  . Smoking status: Never Smoker   . Smokeless tobacco: Not on file  . Alcohol Use: No    Allergies: No Known Allergies  Meds:  No prescriptions prior to admission    ROS: Pertinent findings in history of present illness.  Physical Exam  Blood pressure 118/71, pulse 93, temperature 97.9 F (36.6 C), temperature source Oral, resp. rate 18, height 5\' 7"  (1.702 m), weight 95.709 kg (211 lb), last menstrual period 07/11/2011. GENERAL: Well-developed, well-nourished female in no acute distress.  HEENT: normocephalic HEART: normal rate RESP: normal effort ABDOMEN: Soft, non-tender, gravid appropriate for gestational age EXTREMITIES: Nontender, no edema NEURO: alert and oriented SPECULUM EXAM: Deferred    FHT:  Baseline 135, moderate variability, accelerations present, no decelerations Contractions: occasional, palpate mild   Labs: Results for orders  placed during the hospital encounter of 03/16/12 (from the past 24 hour(s))  URINALYSIS, ROUTINE W REFLEX MICROSCOPIC     Status: Normal   Collection Time   03/16/12  6:11 PM      Component Value Range   Color, Urine YELLOW  YELLOW   APPearance CLEAR  CLEAR   Specific Gravity, Urine 1.020  1.005 - 1.030   pH 7.0  5.0 - 8.0   Glucose, UA NEGATIVE  NEGATIVE mg/dL   Hgb urine dipstick NEGATIVE  NEGATIVE   Bilirubin Urine NEGATIVE  NEGATIVE   Ketones, ur NEGATIVE  NEGATIVE mg/dL   Protein, ur NEGATIVE  NEGATIVE mg/dL   Urobilinogen, UA 0.2  0.0 - 1.0 mg/dL   Nitrite NEGATIVE  NEGATIVE   Leukocytes, UA NEGATIVE  NEGATIVE     Assessment: 1. Headache in pregnancy     Plan: RN discussed pt assessment with Dr Senaida Ores Percocet PO given in MAU with reduction in pt pain Discharge home Labor precautions and fetal kick counts F/U with Dr Senaida Ores Return to MAU as needed    Medication List    Notice       You have not been prescribed any medications.         Sharen Counter Certified Nurse-Midwife 03/16/2012 7:32 PM

## 2012-03-16 NOTE — MAU Note (Signed)
Pt in for headache.  States it started around 0800 and has not resolved.  Took 2 tylenol, no relief.  Has hx of headaches but state they normally go away after eating.  Denies any blurred vision or dizziness.  Denies any ruq pain.  Denies bleeding or lof.  + FM.

## 2012-03-16 NOTE — MAU Note (Signed)
Pt states typically has headaches, however headache is worse today. Took 2 tylenol with no relief. Denies contractions, no abnormal vaginal d/c or bleeding.

## 2012-03-29 ENCOUNTER — Inpatient Hospital Stay (HOSPITAL_COMMUNITY)
Admission: AD | Admit: 2012-03-29 | Discharge: 2012-03-29 | Disposition: A | Discharge status: Home / Self Care | Payer: Medicaid Other | Source: Ambulatory Visit | Attending: Obstetrics and Gynecology | Admitting: Obstetrics and Gynecology

## 2012-03-29 ENCOUNTER — Encounter (HOSPITAL_COMMUNITY): Payer: Self-pay | Admitting: *Deleted

## 2012-03-29 DIAGNOSIS — O34219 Maternal care for unspecified type scar from previous cesarean delivery: Secondary | ICD-10-CM | POA: Insufficient documentation

## 2012-03-29 DIAGNOSIS — O479 False labor, unspecified: Secondary | ICD-10-CM | POA: Insufficient documentation

## 2012-03-29 NOTE — MAU Note (Signed)
Pt presents for possible ROM.  States having a trickle of clear fluid after voiding at approximately 4pm today, but no leaking since.  She is also having some irregular contractions.  Reports good fetal movement, denies any bleeding.

## 2012-03-29 NOTE — MAU Note (Signed)
Pt G2 P1 at 38wks, leaking clear fluid at 1600 and having contractions.  Denies problems with pregnancy.  Prev C/S plans for VBAC.

## 2012-04-03 ENCOUNTER — Inpatient Hospital Stay (HOSPITAL_COMMUNITY)
Admission: AD | Admit: 2012-04-03 | Discharge: 2012-04-04 | Disposition: A | Discharge status: Home / Self Care | Payer: Medicaid Other | Source: Ambulatory Visit | Attending: Obstetrics and Gynecology | Admitting: Obstetrics and Gynecology

## 2012-04-03 ENCOUNTER — Encounter (HOSPITAL_COMMUNITY): Payer: Self-pay

## 2012-04-03 DIAGNOSIS — O479 False labor, unspecified: Secondary | ICD-10-CM | POA: Insufficient documentation

## 2012-04-03 LAB — URINALYSIS, ROUTINE W REFLEX MICROSCOPIC
Glucose, UA: NEGATIVE mg/dL
Hgb urine dipstick: NEGATIVE
Specific Gravity, Urine: >1.03 — ABNORMAL HIGH (ref 1.005–1.030)

## 2012-04-03 NOTE — MAU Note (Signed)
Pt G2 P1 at 38.5wks with pelvic pain and contractions.

## 2012-04-08 ENCOUNTER — Encounter (HOSPITAL_COMMUNITY): Payer: Self-pay | Admitting: Anesthesiology

## 2012-04-08 ENCOUNTER — Inpatient Hospital Stay (HOSPITAL_COMMUNITY)
Admission: AD | Admit: 2012-04-08 | Discharge: 2012-04-12 | DRG: 765 | Disposition: A | Discharge status: Home / Self Care | Payer: Medicaid Other | Source: Ambulatory Visit | Attending: Obstetrics and Gynecology | Admitting: Obstetrics and Gynecology

## 2012-04-08 ENCOUNTER — Inpatient Hospital Stay (HOSPITAL_COMMUNITY): Payer: Medicaid Other | Admitting: Anesthesiology

## 2012-04-08 ENCOUNTER — Encounter (HOSPITAL_COMMUNITY): Payer: Self-pay | Admitting: *Deleted

## 2012-04-08 DIAGNOSIS — Z9851 Tubal ligation status: Secondary | ICD-10-CM

## 2012-04-08 DIAGNOSIS — Z302 Encounter for sterilization: Secondary | ICD-10-CM

## 2012-04-08 DIAGNOSIS — O4290 Premature rupture of membranes, unspecified as to length of time between rupture and onset of labor, unspecified weeks of gestation: Secondary | ICD-10-CM | POA: Diagnosis present

## 2012-04-08 DIAGNOSIS — O34219 Maternal care for unspecified type scar from previous cesarean delivery: Secondary | ICD-10-CM | POA: Diagnosis present

## 2012-04-08 DIAGNOSIS — Z98891 History of uterine scar from previous surgery: Secondary | ICD-10-CM

## 2012-04-08 DIAGNOSIS — O411090 Infection of amniotic sac and membranes, unspecified, unspecified trimester, not applicable or unspecified: Secondary | ICD-10-CM | POA: Diagnosis present

## 2012-04-08 HISTORY — DX: History of uterine scar from previous surgery: Z98.891

## 2012-04-08 HISTORY — DX: Tubal ligation status: Z98.51

## 2012-04-08 LAB — CBC
HCT: 36.7 % (ref 36.0–46.0)
MCHC: 33.8 g/dL (ref 30.0–36.0)
MCV: 80.5 fL (ref 78.0–100.0)
RDW: 13.1 % (ref 11.5–15.5)

## 2012-04-08 LAB — TYPE AND SCREEN

## 2012-04-08 LAB — OB RESULTS CONSOLE GBS: GBS: NEGATIVE

## 2012-04-08 MED ORDER — LACTATED RINGERS IV SOLN
INTRAVENOUS | Status: DC
  Administered 2012-04-08 – 2012-04-09 (×3): via INTRAVENOUS

## 2012-04-08 MED ORDER — OXYCODONE-ACETAMINOPHEN 5-325 MG PO TABS: 1.0000 | ORAL_TABLET | ORAL | Status: DC | PRN

## 2012-04-08 MED ORDER — LIDOCAINE HCL (PF) 1 % IJ SOLN
INTRAMUSCULAR | Status: DC | PRN
  Administered 2012-04-08 (×2): 8 mL

## 2012-04-08 MED ORDER — LIDOCAINE HCL (PF) 1 % IJ SOLN: 30.0000 mL | INTRAMUSCULAR | Status: DC | PRN

## 2012-04-08 MED ORDER — PHENYLEPHRINE 40 MCG/ML (10ML) SYRINGE FOR IV PUSH (FOR BLOOD PRESSURE SUPPORT): 80.0000 ug | PREFILLED_SYRINGE | INTRAVENOUS | Status: DC | PRN

## 2012-04-08 MED ORDER — EPHEDRINE 5 MG/ML INJ
10.0000 mg | INTRAVENOUS | Status: DC | PRN
  Filled 2012-04-08: qty 4

## 2012-04-08 MED ORDER — OXYTOCIN 40 UNITS IN LACTATED RINGERS INFUSION - SIMPLE MED
1.0000 m[IU]/min | INTRAVENOUS | Status: DC
  Administered 2012-04-08: 2 m[IU]/min via INTRAVENOUS
  Filled 2012-04-08: qty 1000

## 2012-04-08 MED ORDER — EPHEDRINE 5 MG/ML INJ: 10.0000 mg | INTRAVENOUS | Status: DC | PRN

## 2012-04-08 MED ORDER — LACTATED RINGERS IV SOLN
500.0000 mL | Freq: Once | INTRAVENOUS | Status: AC
  Administered 2012-04-08: 300 mL via INTRAVENOUS

## 2012-04-08 MED ORDER — LACTATED RINGERS IV SOLN: 500.0000 mL | INTRAVENOUS | Status: DC | PRN

## 2012-04-08 MED ORDER — OXYTOCIN 40 UNITS IN LACTATED RINGERS INFUSION - SIMPLE MED: 62.5000 mL/h | INTRAVENOUS | Status: DC

## 2012-04-08 MED ORDER — ACETAMINOPHEN 325 MG PO TABS: 650.0000 mg | ORAL_TABLET | ORAL | Status: DC | PRN

## 2012-04-08 MED ORDER — FLEET ENEMA 7-19 GM/118ML RE ENEM: 1.0000 | ENEMA | RECTAL | Status: DC | PRN

## 2012-04-08 MED ORDER — IBUPROFEN 600 MG PO TABS: 600.0000 mg | ORAL_TABLET | Freq: Four times a day (QID) | ORAL | Status: DC | PRN

## 2012-04-08 MED ORDER — ONDANSETRON HCL 4 MG/2ML IJ SOLN: 4.0000 mg | Freq: Four times a day (QID) | INTRAMUSCULAR | Status: DC | PRN

## 2012-04-08 MED ORDER — TERBUTALINE SULFATE 1 MG/ML IJ SOLN: 0.2500 mg | Freq: Once | INTRAMUSCULAR | Status: AC | PRN

## 2012-04-08 MED ORDER — CITRIC ACID-SODIUM CITRATE 334-500 MG/5ML PO SOLN
30.0000 mL | ORAL | Status: DC | PRN
  Administered 2012-04-09: 30 mL via ORAL
  Filled 2012-04-08: qty 15

## 2012-04-08 MED ORDER — PHENYLEPHRINE 40 MCG/ML (10ML) SYRINGE FOR IV PUSH (FOR BLOOD PRESSURE SUPPORT)
80.0000 ug | PREFILLED_SYRINGE | INTRAVENOUS | Status: DC | PRN
  Filled 2012-04-08: qty 5

## 2012-04-08 MED ORDER — FENTANYL 2.5 MCG/ML BUPIVACAINE 1/10 % EPIDURAL INFUSION (WH - ANES)
14.0000 mL/h | INTRAMUSCULAR | Status: DC
  Administered 2012-04-08 – 2012-04-09 (×2): 14 mL/h via EPIDURAL
  Filled 2012-04-08 (×3): qty 125

## 2012-04-08 MED ORDER — DIPHENHYDRAMINE HCL 50 MG/ML IJ SOLN: 12.5000 mg | INTRAMUSCULAR | Status: DC | PRN

## 2012-04-08 MED ORDER — OXYTOCIN BOLUS FROM INFUSION: 500.0000 mL | INTRAVENOUS | Status: DC

## 2012-04-08 MED ORDER — FENTANYL 2.5 MCG/ML BUPIVACAINE 1/10 % EPIDURAL INFUSION (WH - ANES)
INTRAMUSCULAR | Status: DC | PRN
  Administered 2012-04-08: 14 mL/h via EPIDURAL

## 2012-04-08 NOTE — Progress Notes (Signed)
Pt to L lat and not R lat as initially charted

## 2012-04-08 NOTE — Progress Notes (Signed)
Patient ID: Christina [name], female   DOB: [birth year], 21 y.o.   MRN: [id number]  No c/o's.  Comfortable with epidural  AF VSS gen NAD  FHTs 130-140's, reactive, category 1 toco q SVE 2-3/80/-2 IUPC placed w/o diff/ comp  21yo G2P0101 at 39+ with SROM Epidural for comfort Pitocin to augment IUPC for ctx, pitocin mgmt

## 2012-04-08 NOTE — Progress Notes (Signed)
Dr Ambrose Mantle notified of pt's admission and status. Aware of one variable with reactive strip since, SROM with cl fld, ctx pattern, sve. Will admit to BS.

## 2012-04-08 NOTE — Progress Notes (Signed)
Patient ID: Christina [name], female   DOB: May 16, 1990, 21 y.o.   MRN: [id number]  21yo G2P0101 at 39+ with SROM this AM for clear fluid.  Comfortable with epidural.    AF VSS gen NAD  FHTs 130-140's, mod variability toco Q 2 - ; mVU 160-200  SVE 2.3/80-90/-2, unchanged in 5-7 hrs  D/W pt slow/No cervical change in several hours with adequate contractions, however in latent phase of labor....  Pt really desires TOLAC and wants to continue, understands is not getting in to active labor.  D/w pt can continue current management - will follow thru night.  D/W pt likelihood of rLTCS.

## 2012-04-08 NOTE — MAU Note (Signed)
When Dr Ambrose Mantle given report also aware of pt desiring TOLAC.

## 2012-04-08 NOTE — Anesthesia Preprocedure Evaluation (Addendum)
Anesthesia Evaluation  Patient identified by MRN, date of birth, ID band Patient awake    Reviewed: Allergy & Precautions, H&P , NPO status , Patient's Chart, lab work & pertinent test results  Airway Mallampati: II TM Distance: >3 FB Neck ROM: full    Dental No notable dental hx.    Pulmonary neg pulmonary ROS,    Pulmonary exam normal       Cardiovascular     Neuro/Psych    GI/Hepatic negative GI ROS, Neg liver ROS,   Endo/Other  negative endocrine ROS  Renal/GU negative Renal ROS  negative genitourinary   Musculoskeletal negative musculoskeletal ROS (+)   Abdominal Normal abdominal exam  (+)   Peds negative pediatric ROS (+)  Hematology negative hematology ROS (+)   Anesthesia Other Findings   Reproductive/Obstetrics (+) Pregnancy (h/o c/s x1, failed VBAC)                          Anesthesia Physical Anesthesia Plan  ASA: II and emergent  Anesthesia Plan: Epidural   Post-op Pain Management:    Induction:   Airway Management Planned:   Additional Equipment:   Intra-op Plan:   Post-operative Plan:   Informed Consent: I have reviewed the patients History and Physical, chart, labs and discussed the procedure including the risks, benefits and alternatives for the proposed anesthesia with the patient or authorized representative who has indicated his/her understanding and acceptance.     Plan Discussed with: Surgeon and CRNA  Anesthesia Plan Comments:        Anesthesia Quick Evaluation

## 2012-04-08 NOTE — Progress Notes (Addendum)
Patient ID: Christina [name], female   DOB: [birth year], 21 y.o.   MRN: [id number]  21yo G2P0101 at 39+ with SROM for clear fluid this AM desires TOLAC, d/w pt again r/b/a, pt also desires BTL.  AFVSS' gen NAD FHTs 130's, category 1, no decels toco irregular  Will continue iol with pitocin, will start Epidural prn  SVD1/30/-2, vtx

## 2012-04-08 NOTE — Anesthesia Procedure Notes (Signed)
Epidural Patient location during procedure: OB Start time: 04/08/2012 12:43 PM End time: 04/08/2012 12:47 PM  Staffing Anesthesiologist: Sandrea Hughs Performed by: anesthesiologist   Preanesthetic Checklist Completed: patient identified, site marked, surgical consent, pre-op evaluation, timeout performed, IV checked, risks and benefits discussed and monitors and equipment checked  Epidural Patient position: sitting Prep: site prepped and draped and DuraPrep Patient monitoring: continuous pulse ox and blood pressure Approach: midline Injection technique: LOR air  Needle:  Needle type: Tuohy  Needle gauge: 17 G Needle length: 9 cm and 9 Needle insertion depth: 6 cm Catheter type: closed end flexible Catheter size: 19 Gauge Catheter at skin depth: 11 cm Test dose: negative and Other  Assessment Sensory level: T8 Events: blood not aspirated, injection not painful, no injection resistance, negative IV test and no paresthesia  Additional Notes Reason for block:procedure for pain

## 2012-04-08 NOTE — Progress Notes (Signed)
Dr Ambrose Mantle in to see pt

## 2012-04-08 NOTE — H&P (Signed)
[name], [name] NO.:  [id number]  MEDICAL RECORD NO.:  [id number]  LOCATION:  9162                          FACILITY:  [facility]  PHYSICIAN:  Malachi Pro. Ambrose Mantle, M.D. DATE OF BIRTH:  10/13/1990  DATE OF ADMISSION:  04/08/2012 DATE OF DISCHARGE:                             HISTORY & PHYSICAL   PRESENT ILLNESS:  This is a 21 year old black female, para 0-1-0-1, gravida 2 with [facility] April 12, 2012 by ultrasound at 9 weeks and 5 days done on Sep 13, 2011, admitted with premature rupture of the membranes. Blood group and type A positive, negative antibody.  Pap smear normal. Rubella immune.  RPR nonreactive.  Urine culture negative.  Hepatitis B surface antigen negative.  HIV negative, GC and Chlamydia negative. Hemoglobin AA.  First trimester screen negative.  Cystic fibrosis screen negative.  AFP normal.  The patient began her prenatal course at our office on Sep 30, 2011, at 12 weeks and 1 day.  At 20 weeks, the patient had blood in her urine and was thought to possibly have kidney stones. One hour Glucola was normal at 68.  She did have a normal creatinine clearance and normal 24 hour urine protein at 28 weeks.  The patient planned VBAC early in pregnancy and also wanted sterilization.  She signed her VBAC forms but is informed again today that there can be risk to the baby with VBAC, major risk of rupturing the uterus or Pitocin short interval from last pregnancy.  Chorioamnionitis at the time of the C-section and single-layer closure.  The only risk factor that she would be a candidate would be use of Pitocin.  A group B strep culture was negative.  Ultrasound at 37-week showed normal growth and normal fluid. PIH labs were normal.  At 4:15 a.m. today, she had spontaneous rupture of membranes, and came to the hospital where a gross rupture of membrane with clear fluid was confirmed by the admitting nurse.  When she was first placed on the monitor, there was a  deceleration, but subsequently there were no decelerations and she had good reactivity.  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  SURGICAL HISTORY:  C-section August 17, 2009, HELLP syndrome, wisdom teeth extraction.  ALLERGIES:  No known food allergies, drug allergies or latex allergy.  FAMILY HISTORY:  Mother with anxiety disorder, depression, hypertension, and thyroid disease.  Uncle was an alcoholic.  Maternal grandmother with COPD and high blood pressure as well as lung cancer.  OBSTETRIC HISTORY:  In April 2011 at 33 weeks she developed HELLP syndrome, delivered a 4 pounds 2 ounce female by C-section.  SOCIAL HISTORY:  The patient denies tobacco, alcohol, and illicit substance abuse.  Married since 2012.  Lives with her husband and child. She is active but no formal exercise.  PHYSICAL EXAMINATION:  VITAL SIGNS:  On admission, temperature 98.3, pulse 95, respirations 20, blood pressure 130/77. HEART:  Normal size and sounds.  No murmurs. LUNGS:  Clear to auscultation.  At her last  prenatal visit, the fundal height was 36 cm.  Fetal heart tones were normal.  She is having some contractions every 5 minutes or so, with no decelerations.  Per the nurse's exam, she was 1 cm and 70% effaced.  ADMITTING IMPRESSION:  Intrauterine pregnancy at 39+ weeks, prior C- section, desires trial of labor after cesarean.  The patient is informed again about the risks of vaginal birth after cesarean and the major risk factors.  She understands and agrees to proceed.  She also desires at this time to have sterilization.     Malachi Pro. Ambrose Mantle, M.D.     TFH/MEDQ  D:  04/08/2012  T:  04/08/2012  Job:  [id number]

## 2012-04-08 NOTE — MAU Note (Signed)
Leaking cl fld since 0415. Some contractions

## 2012-04-09 ENCOUNTER — Inpatient Hospital Stay (HOSPITAL_COMMUNITY): Payer: Medicaid Other | Admitting: Anesthesiology

## 2012-04-09 ENCOUNTER — Encounter (HOSPITAL_COMMUNITY)
Admission: AD | Disposition: A | Discharge status: Home / Self Care | Payer: Self-pay | Source: Ambulatory Visit | Attending: Obstetrics and Gynecology

## 2012-04-09 ENCOUNTER — Encounter (HOSPITAL_COMMUNITY): Payer: Self-pay | Admitting: Obstetrics and Gynecology

## 2012-04-09 ENCOUNTER — Encounter (HOSPITAL_COMMUNITY): Payer: Self-pay | Admitting: Anesthesiology

## 2012-04-09 DIAGNOSIS — O34219 Maternal care for unspecified type scar from previous cesarean delivery: Secondary | ICD-10-CM

## 2012-04-09 DIAGNOSIS — Z98891 History of uterine scar from previous surgery: Secondary | ICD-10-CM

## 2012-04-09 HISTORY — DX: History of uterine scar from previous surgery: Z98.891

## 2012-04-09 LAB — CBC
HCT: 36.3 % (ref 36.0–46.0)
MCV: 81.8 fL (ref 78.0–100.0)
Platelets: 136 10*3/uL — ABNORMAL LOW (ref 150–400)
RBC: 4.44 MIL/uL (ref 3.87–5.11)
RDW: 13.2 % (ref 11.5–15.5)
WBC: 13.8 10*3/uL — ABNORMAL HIGH (ref 4.0–10.5)

## 2012-04-09 SURGERY — Surgical Case
Anesthesia: Epidural | Site: Abdomen | Wound class: Clean Contaminated

## 2012-04-09 MED ORDER — FENTANYL CITRATE 0.05 MG/ML IJ SOLN
INTRAMUSCULAR | Status: AC
  Administered 2012-04-09: 50 ug via INTRAVENOUS
  Filled 2012-04-09: qty 2

## 2012-04-09 MED ORDER — OXYTOCIN 40 UNITS IN LACTATED RINGERS INFUSION - SIMPLE MED: 1.0000 m[IU]/min | INTRAVENOUS | Status: DC

## 2012-04-09 MED ORDER — CEFAZOLIN SODIUM-DEXTROSE 2-3 GM-% IV SOLR
INTRAVENOUS | Status: DC | PRN
  Administered 2012-04-09: 2 g via INTRAVENOUS

## 2012-04-09 MED ORDER — MEPERIDINE HCL 25 MG/ML IJ SOLN: 6.2500 mg | INTRAMUSCULAR | Status: DC | PRN

## 2012-04-09 MED ORDER — DIPHENHYDRAMINE HCL 50 MG/ML IJ SOLN: 25.0000 mg | INTRAMUSCULAR | Status: DC | PRN

## 2012-04-09 MED ORDER — OXYCODONE-ACETAMINOPHEN 5-325 MG PO TABS
1.0000 | ORAL_TABLET | ORAL | Status: DC | PRN
  Administered 2012-04-10 – 2012-04-11 (×5): 1 via ORAL
  Administered 2012-04-11: 2 via ORAL
  Administered 2012-04-11 – 2012-04-12 (×2): 1 via ORAL
  Administered 2012-04-12: 2 via ORAL
  Filled 2012-04-09 (×7): qty 1
  Filled 2012-04-09: qty 2
  Filled 2012-04-09: qty 1

## 2012-04-09 MED ORDER — OXYTOCIN 10 UNIT/ML IJ SOLN
INTRAMUSCULAR | Status: AC
  Filled 2012-04-09: qty 4

## 2012-04-09 MED ORDER — FENTANYL CITRATE 0.05 MG/ML IJ SOLN
25.0000 ug | INTRAMUSCULAR | Status: DC | PRN
  Administered 2012-04-09 (×2): 50 ug via INTRAVENOUS

## 2012-04-09 MED ORDER — MENTHOL 3 MG MT LOZG: 1.0000 | LOZENGE | OROMUCOSAL | Status: DC | PRN

## 2012-04-09 MED ORDER — MISOPROSTOL 100 MCG PO TABS
ORAL_TABLET | ORAL | Status: DC | PRN
  Administered 2012-04-09: 800 ug via RECTAL

## 2012-04-09 MED ORDER — OXYTOCIN 10 UNIT/ML IJ SOLN
40.0000 [IU] | INTRAVENOUS | Status: DC | PRN
  Administered 2012-04-09: 40 [IU] via INTRAVENOUS

## 2012-04-09 MED ORDER — SIMETHICONE 80 MG PO CHEW: 80.0000 mg | CHEWABLE_TABLET | ORAL | Status: DC | PRN

## 2012-04-09 MED ORDER — LANOLIN HYDROUS EX OINT: 1.0000 "application " | TOPICAL_OINTMENT | CUTANEOUS | Status: DC | PRN

## 2012-04-09 MED ORDER — LACTATED RINGERS IV SOLN
INTRAVENOUS | Status: DC | PRN
  Administered 2012-04-09 (×3): via INTRAVENOUS

## 2012-04-09 MED ORDER — KETOROLAC TROMETHAMINE 60 MG/2ML IM SOLN
60.0000 mg | Freq: Once | INTRAMUSCULAR | Status: AC | PRN
  Filled 2012-04-09: qty 2

## 2012-04-09 MED ORDER — DIBUCAINE 1 % RE OINT: 1.0000 "application " | TOPICAL_OINTMENT | RECTAL | Status: DC | PRN

## 2012-04-09 MED ORDER — NALBUPHINE HCL 10 MG/ML IJ SOLN
5.0000 mg | INTRAMUSCULAR | Status: DC | PRN
  Filled 2012-04-09: qty 1

## 2012-04-09 MED ORDER — NALOXONE HCL 0.4 MG/ML IJ SOLN: 0.4000 mg | INTRAMUSCULAR | Status: DC | PRN

## 2012-04-09 MED ORDER — ONDANSETRON HCL 4 MG/2ML IJ SOLN
INTRAMUSCULAR | Status: DC | PRN
  Administered 2012-04-09: 4 mg via INTRAVENOUS

## 2012-04-09 MED ORDER — DIPHENHYDRAMINE HCL 25 MG PO CAPS: 25.0000 mg | ORAL_CAPSULE | Freq: Four times a day (QID) | ORAL | Status: DC | PRN

## 2012-04-09 MED ORDER — ONDANSETRON HCL 4 MG/2ML IJ SOLN
INTRAMUSCULAR | Status: AC
  Filled 2012-04-09: qty 2

## 2012-04-09 MED ORDER — SCOPOLAMINE 1 MG/3DAYS TD PT72
MEDICATED_PATCH | TRANSDERMAL | Status: AC
  Administered 2012-04-09: 1.5 mg via TRANSDERMAL
  Filled 2012-04-09: qty 1

## 2012-04-09 MED ORDER — NALOXONE HCL 1 MG/ML IJ SOLN
1.0000 ug/kg/h | INTRAVENOUS | Status: DC | PRN
  Filled 2012-04-09: qty 2

## 2012-04-09 MED ORDER — LACTATED RINGERS IV SOLN: INTRAVENOUS | Status: DC

## 2012-04-09 MED ORDER — DIPHENHYDRAMINE HCL 50 MG/ML IJ SOLN: 12.5000 mg | INTRAMUSCULAR | Status: DC | PRN

## 2012-04-09 MED ORDER — IBUPROFEN 800 MG PO TABS
800.0000 mg | ORAL_TABLET | Freq: Three times a day (TID) | ORAL | Status: DC
  Administered 2012-04-09 – 2012-04-12 (×7): 800 mg via ORAL
  Filled 2012-04-09 (×7): qty 1

## 2012-04-09 MED ORDER — ZOLPIDEM TARTRATE 5 MG PO TABS: 5.0000 mg | ORAL_TABLET | Freq: Every evening | ORAL | Status: DC | PRN

## 2012-04-09 MED ORDER — SENNOSIDES-DOCUSATE SODIUM 8.6-50 MG PO TABS
2.0000 | ORAL_TABLET | Freq: Every day | ORAL | Status: DC
  Administered 2012-04-09 – 2012-04-11 (×3): 2 via ORAL

## 2012-04-09 MED ORDER — MORPHINE SULFATE (PF) 0.5 MG/ML IJ SOLN
INTRAMUSCULAR | Status: DC | PRN
  Administered 2012-04-09: 4 mg via EPIDURAL

## 2012-04-09 MED ORDER — KETOROLAC TROMETHAMINE 30 MG/ML IJ SOLN: 30.0000 mg | Freq: Four times a day (QID) | INTRAMUSCULAR | Status: AC | PRN

## 2012-04-09 MED ORDER — SODIUM CHLORIDE 0.9 % IR SOLN
Status: DC | PRN
  Administered 2012-04-09: 1000 mL

## 2012-04-09 MED ORDER — TETANUS-DIPHTH-ACELL PERTUSSIS 5-2.5-18.5 LF-MCG/0.5 IM SUSP: 0.5000 mL | Freq: Once | INTRAMUSCULAR | Status: DC

## 2012-04-09 MED ORDER — LIDOCAINE-EPINEPHRINE (PF) 2 %-1:200000 IJ SOLN
INTRAMUSCULAR | Status: AC
  Filled 2012-04-09: qty 20

## 2012-04-09 MED ORDER — CEFAZOLIN SODIUM-DEXTROSE 2-3 GM-% IV SOLR
INTRAVENOUS | Status: AC
  Filled 2012-04-09: qty 50

## 2012-04-09 MED ORDER — KETOROLAC TROMETHAMINE 30 MG/ML IJ SOLN
INTRAMUSCULAR | Status: AC
  Administered 2012-04-09: 30 mg via INTRAVENOUS
  Filled 2012-04-09: qty 1

## 2012-04-09 MED ORDER — PRENATAL MULTIVITAMIN CH
1.0000 | ORAL_TABLET | Freq: Every day | ORAL | Status: DC
  Administered 2012-04-10 – 2012-04-12 (×3): 1 via ORAL
  Filled 2012-04-09 (×3): qty 1

## 2012-04-09 MED ORDER — SODIUM CHLORIDE 0.9 % IJ SOLN: 3.0000 mL | INTRAMUSCULAR | Status: DC | PRN

## 2012-04-09 MED ORDER — SODIUM BICARBONATE 8.4 % IV SOLN
INTRAVENOUS | Status: AC
  Filled 2012-04-09: qty 50

## 2012-04-09 MED ORDER — METOCLOPRAMIDE HCL 5 MG/ML IJ SOLN: 10.0000 mg | Freq: Three times a day (TID) | INTRAMUSCULAR | Status: DC | PRN

## 2012-04-09 MED ORDER — SCOPOLAMINE 1 MG/3DAYS TD PT72
1.0000 | MEDICATED_PATCH | Freq: Once | TRANSDERMAL | Status: AC
  Administered 2012-04-09: 1.5 mg via TRANSDERMAL

## 2012-04-09 MED ORDER — OXYTOCIN 40 UNITS IN LACTATED RINGERS INFUSION - SIMPLE MED: 62.5000 mL/h | INTRAVENOUS | Status: AC

## 2012-04-09 MED ORDER — SODIUM BICARBONATE 8.4 % IV SOLN
INTRAVENOUS | Status: DC | PRN
  Administered 2012-04-09: 10 mL via EPIDURAL
  Administered 2012-04-09 (×2): 5 mL via EPIDURAL

## 2012-04-09 MED ORDER — WITCH HAZEL-GLYCERIN EX PADS: 1.0000 "application " | MEDICATED_PAD | CUTANEOUS | Status: DC | PRN

## 2012-04-09 MED ORDER — MISOPROSTOL 200 MCG PO TABS
ORAL_TABLET | ORAL | Status: AC
  Filled 2012-04-09: qty 5

## 2012-04-09 MED ORDER — SIMETHICONE 80 MG PO CHEW
80.0000 mg | CHEWABLE_TABLET | Freq: Three times a day (TID) | ORAL | Status: DC
  Administered 2012-04-09 – 2012-04-12 (×9): 80 mg via ORAL

## 2012-04-09 MED ORDER — MORPHINE SULFATE (PF) 0.5 MG/ML IJ SOLN
INTRAMUSCULAR | Status: DC | PRN
  Administered 2012-04-09: .5 mg via INTRAVENOUS
  Administered 2012-04-09: .5 mg via EPIDURAL

## 2012-04-09 MED ORDER — MORPHINE SULFATE 0.5 MG/ML IJ SOLN
INTRAMUSCULAR | Status: AC
  Filled 2012-04-09: qty 10

## 2012-04-09 MED ORDER — CEFAZOLIN SODIUM-DEXTROSE 2-3 GM-% IV SOLR: 2.0000 g | INTRAVENOUS | Status: DC

## 2012-04-09 MED ORDER — IBUPROFEN 600 MG PO TABS
600.0000 mg | ORAL_TABLET | Freq: Four times a day (QID) | ORAL | Status: DC | PRN
  Filled 2012-04-09 (×3): qty 1

## 2012-04-09 MED ORDER — DIPHENHYDRAMINE HCL 25 MG PO CAPS
25.0000 mg | ORAL_CAPSULE | ORAL | Status: DC | PRN
  Filled 2012-04-09: qty 1

## 2012-04-09 MED ORDER — ONDANSETRON HCL 4 MG PO TABS: 4.0000 mg | ORAL_TABLET | ORAL | Status: DC | PRN

## 2012-04-09 MED ORDER — ONDANSETRON HCL 4 MG/2ML IJ SOLN: 4.0000 mg | Freq: Three times a day (TID) | INTRAMUSCULAR | Status: DC | PRN

## 2012-04-09 MED ORDER — KETOROLAC TROMETHAMINE 30 MG/ML IJ SOLN
30.0000 mg | Freq: Four times a day (QID) | INTRAMUSCULAR | Status: AC | PRN
  Administered 2012-04-09: 30 mg via INTRAVENOUS

## 2012-04-09 MED ORDER — ONDANSETRON HCL 4 MG/2ML IJ SOLN: 4.0000 mg | INTRAMUSCULAR | Status: DC | PRN

## 2012-04-09 SURGICAL SUPPLY — 38 items
APL SKNCLS STERI-STRIP NONHPOA (GAUZE/BANDAGES/DRESSINGS) ×1
BENZOIN TINCTURE PRP APPL 2/3 (GAUZE/BANDAGES/DRESSINGS) ×1 IMPLANT
CLOTH BEACON ORANGE TIMEOUT ST (SAFETY) ×2 IMPLANT
CONTAINER PREFILL 10% NBF 15ML (MISCELLANEOUS) ×2 IMPLANT
DRESSING TELFA 8X3 (GAUZE/BANDAGES/DRESSINGS) ×1 IMPLANT
DRSG COVADERM 4X10 (GAUZE/BANDAGES/DRESSINGS) ×1 IMPLANT
DURAPREP 26ML APPLICATOR (WOUND CARE) ×2 IMPLANT
ELECT REM PT RETURN 9FT ADLT (ELECTROSURGICAL) ×2
ELECTRODE REM PT RTRN 9FT ADLT (ELECTROSURGICAL) ×1 IMPLANT
EXTRACTOR VACUUM M CUP 4 TUBE (SUCTIONS) ×1 IMPLANT
GLOVE BIO SURGEON STRL SZ 6.5 (GLOVE) ×2 IMPLANT
GLOVE BIO SURGEON STRL SZ7 (GLOVE) ×2 IMPLANT
GOWN PREVENTION PLUS LG XLONG (DISPOSABLE) ×6 IMPLANT
KIT ABG SYR 3ML LUER SLIP (SYRINGE) IMPLANT
NDL HYPO 25X5/8 SAFETYGLIDE (NEEDLE) IMPLANT
NEEDLE HYPO 25X5/8 SAFETYGLIDE (NEEDLE) IMPLANT
NS IRRIG 1000ML POUR BTL (IV SOLUTION) ×2 IMPLANT
PACK C SECTION WH (CUSTOM PROCEDURE TRAY) ×2 IMPLANT
PAD ABD 7.5X8 STRL (GAUZE/BANDAGES/DRESSINGS) ×1 IMPLANT
PAD OB MATERNITY 4.3X12.25 (PERSONAL CARE ITEMS) IMPLANT
RTRCTR C-SECT PINK 25CM LRG (MISCELLANEOUS) ×2 IMPLANT
SLEEVE SCD COMPRESS KNEE MED (MISCELLANEOUS) ×1 IMPLANT
STAPLER VISISTAT 35W (STAPLE) IMPLANT
STRIP CLOSURE SKIN 1/2X4 (GAUZE/BANDAGES/DRESSINGS) ×1 IMPLANT
SUT MNCRL 0 VIOLET CTX 36 (SUTURE) ×2 IMPLANT
SUT MONOCRYL 0 CTX 36 (SUTURE) ×2
SUT PLAIN 1 NONE 54 (SUTURE) IMPLANT
SUT PLAIN 2 0 XLH (SUTURE) ×2 IMPLANT
SUT VIC AB 0 CT1 27 (SUTURE) ×4
SUT VIC AB 0 CT1 27XBRD ANBCTR (SUTURE) ×2 IMPLANT
SUT VIC AB 2-0 CT1 27 (SUTURE) ×2
SUT VIC AB 2-0 CT1 TAPERPNT 27 (SUTURE) ×1 IMPLANT
SUT VIC AB 4-0 KS 27 (SUTURE) ×1 IMPLANT
SYR BULB IRRIGATION 50ML (SYRINGE) ×1 IMPLANT
TAPE CLOTH SURG 4X10 WHT LF (GAUZE/BANDAGES/DRESSINGS) ×1 IMPLANT
TOWEL OR 17X24 6PK STRL BLUE (TOWEL DISPOSABLE) ×4 IMPLANT
TRAY FOLEY CATH 14FR (SET/KITS/TRAYS/PACK) ×1 IMPLANT
WATER STERILE IRR 1000ML POUR (IV SOLUTION) ×2 IMPLANT

## 2012-04-09 NOTE — OR Nursing (Addendum)
Uterus massaged by S. Reesa Gotschall Charity fundraiser. Two tubes of cord blood sent to lab. Foley catheter in upon arrival to OR.  5cc of blood evacuated from uterus during uterine massage.

## 2012-04-09 NOTE — Brief Op Note (Addendum)
04/08/2012 - 04/09/2012  10:26 AM  PATIENT:  Christina [name]  21 y.o. female  PRE-OPERATIVE DIAGNOSIS:  arrest of dilitation/desires sterilization, failed TOLAC, IUP at term, SROM, LOA,  Vacuum assisted delivery  POST-OPERATIVE DIAGNOSIS:  arrest of dilitation/desires sterilization, failed TOLAC, IUP at term, SROM, LOA, vacuum assisted delivery, delivered  PROCEDURE:  Procedure(s) (LRB) with comments: CESAREAN SECTION (N/A) - Repeat cesarean section with delivery of baby boy at [phone].   Apgars 9/9.   Bilateral tubal ligation  SURGEON:  Surgeon(s) and Role:    * Sherron Monday, MD - Primary  ANESTHESIA:   epidural  EBL:  Total I/O In: 2000 [I.V.:2000] Out: 900 [Urine:200; Blood:700]  BLOOD ADMINISTERED:none  DRAINS: Urinary Catheter (Foley)   LOCAL MEDICATIONS USED:  NONE  SPECIMEN:  Source of Specimen:  Placenta, B tubal segments  DISPOSITION OF SPECIMEN:  L&D and pathology  COUNTS:  YES  TOURNIQUET:  * No tourniquets in log *  DICTATION: .Other Dictation: Dictation Number [id number]  PLAN OF CARE: Admit to inpatient   PATIENT DISPOSITION:  PACU - hemodynamically stable.   Delay start of Pharmacological VTE agent (>24hrs) due to surgical blood loss or risk of bleeding: not applicable

## 2012-04-09 NOTE — Progress Notes (Signed)
Patient ID: Christina [name], female   DOB: May 25, 1990, 21 y.o.   MRN: [id number]  Pt is ready!  Declines SVE.  Comfortable with epidural  AFVSS gen NAD FHTs 130's, mod var, occ variables toco q 3 min  Again reviewed r/b/a of LTCS and BTL desires to proceed.

## 2012-04-09 NOTE — Anesthesia Postprocedure Evaluation (Signed)
  Anesthesia Post Note  Patient: Christina [name]  Procedure(s) Performed: Procedure(s) (LRB): CESAREAN SECTION (N/A)  Anesthesia type: Epidural  Patient location: PACU  Post pain: Pain level controlled  Post assessment: Post-op Vital signs reviewed  Last Vitals:  Filed Vitals:   04/09/12 1215  BP: 105/52  Pulse: 73  Temp: 37.1 C  Resp: 20    Post vital signs: stable  Level of consciousness: awake  Complications: No apparent anesthesia complications

## 2012-04-09 NOTE — Anesthesia Preprocedure Evaluation (Signed)
Anesthesia Evaluation  Patient identified by MRN, date of birth, ID band Patient awake    Reviewed: Allergy & Precautions, H&P , NPO status , Patient's Chart, lab work & pertinent test results  Airway Mallampati: II TM Distance: >3 FB Neck ROM: full    Dental No notable dental hx.    Pulmonary neg pulmonary ROS,    Pulmonary exam normal       Cardiovascular     Neuro/Psych    GI/Hepatic negative GI ROS, Neg liver ROS,   Endo/Other  negative endocrine ROS  Renal/GU negative Renal ROS  negative genitourinary   Musculoskeletal negative musculoskeletal ROS (+)   Abdominal Normal abdominal exam  (+)   Peds negative pediatric ROS (+)  Hematology negative hematology ROS (+)   Anesthesia Other Findings   Reproductive/Obstetrics (+) Pregnancy (h/o c/s x1, failed VBAC)                          Anesthesia Physical Anesthesia Plan  ASA: II and emergent  Anesthesia Plan: Epidural   Post-op Pain Management:    Induction:   Airway Management Planned:   Additional Equipment:   Intra-op Plan:   Post-operative Plan:   Informed Consent: I have reviewed the patients History and Physical, chart, labs and discussed the procedure including the risks, benefits and alternatives for the proposed anesthesia with the patient or authorized representative who has indicated his/her understanding and acceptance.     Plan Discussed with: Surgeon and CRNA  Anesthesia Plan Comments:        Anesthesia Quick Evaluation  

## 2012-04-09 NOTE — Progress Notes (Signed)
Patient ID: KINDEL ROCHEFORT, female   DOB: 04-21-1991, 21 y.o.   MRN: [id number]  Pt with pressure with contractions.  Uncomfortable.  Was comfortable, now with pressure  AFVSS gen NAD 130's, mod variability, with variables; category 2 q mVU 160-220  D/w pt r/b/a of rLTCS inc bleeding, infection, damage to surrounding organs, injury to infant, and difficulty healing.  Pt voices understanding.  SVE 4/90/0  D/W pt cervical change, can proceed with cesarean section or continue with TOLAC.  If discomfort can be controlled, pt desires to continue with TOLAC as getting more active in labor.  Will recheck in 2-3 hours and decide POC then.  Pt more comfortable with button being pushed on epidural.

## 2012-04-09 NOTE — Consult Note (Signed)
Neonatology Note:  Attendance at C-section:  I was asked to attend this repeat C/S at term due to failed TOLAC, FTP. The mother is a G2P1 A pos, GBS neg with history of HELLP syndrome and preterm delivery. ROM 30 hours prior to delivery, fluid clear. Mother afebrile during labor and only got a dose of antibiotic just prior to C/S. Vacuum-assisted delivery. Infant vigorous with good spontaneous cry and tone. Needed only minimal bulb suctioning. Ap 9/9. Lungs clear to ausc in DR. To CN to care of Pediatrician.  Siegfried Vieth, MD  

## 2012-04-09 NOTE — Transfer of Care (Signed)
Immediate Anesthesia Transfer of Care Note  Patient: Christina [name]  Procedure(s) Performed: Procedure(s) (LRB) with comments: CESAREAN SECTION (N/A) - Repeat cesarean section with delivery of baby boy at [phone].   Apgars 9/9.   Bilateral tubal ligation  Patient Location: PACU  Anesthesia Type:Epidural  Level of Consciousness: awake, alert  and oriented  Airway & Oxygen Therapy: Patient Spontanous Breathing  Post-op Assessment: Report given to PACU RN and Post -op Vital signs reviewed and stable  Post vital signs: Reviewed and stable  Complications: No apparent anesthesia complications

## 2012-04-09 NOTE — Anesthesia Postprocedure Evaluation (Signed)
  Anesthesia Post-op Note  Patient: Christina [name]  Procedure(s) Performed: Procedure(s) (LRB) with comments: CESAREAN SECTION (N/A)  Patient Location: PACU and Mother/Baby  Anesthesia Type:Epidural  Level of Consciousness: awake, alert  and oriented  Airway and Oxygen Therapy: Patient Spontanous Breathing    Post-op Assessment: Patient's Cardiovascular Status Stable and Respiratory Function Stable  Post-op Vital Signs: stable  Complications: No apparent anesthesia complications

## 2012-04-10 ENCOUNTER — Other Ambulatory Visit (HOSPITAL_COMMUNITY): Payer: Medicaid Other

## 2012-04-10 ENCOUNTER — Encounter (HOSPITAL_COMMUNITY): Payer: Self-pay | Admitting: Obstetrics and Gynecology

## 2012-04-10 LAB — CBC
HCT: 31.6 % — ABNORMAL LOW (ref 36.0–46.0)
Hemoglobin: 10.6 g/dL — ABNORMAL LOW (ref 12.0–15.0)
MCHC: 33.5 g/dL (ref 30.0–36.0)
RBC: 3.91 MIL/uL (ref 3.87–5.11)

## 2012-04-10 NOTE — Progress Notes (Signed)
Subjective: Postpartum Day 1: Cesarean Delivery Patient reports incisional pain and tolerating PO.  Nl lochia, pain controlled  Objective: Vital signs in last 24 hours: Temp:  [98 F (36.7 C)-99.5 F (37.5 C)] 98.3 F (36.8 C) (12/02 0612) Pulse Rate:  [67-100] 100  (12/02 0612) Resp:  [14-24] 19  (12/02 0612) BP: (102-136)/(45-98) 102/68 mmHg (12/02 0612) SpO2:  [95 %-100 %] 98 % (12/02 0612)  Physical Exam:  General: alert and no distress Lochia: appropriate Uterine Fundus: firm Incision: healing well DVT Evaluation: No evidence of DVT seen on physical exam.   Basename 04/10/12 0511 04/09/12 1115  HGB 10.6* 12.0  HCT 31.6* 36.3    Assessment/Plan: Status post Cesarean section. Doing well postoperatively.  Continue current care.  BOVARD,Christina [name] 04/10/2012, 6:27 AM

## 2012-04-10 NOTE — Op Note (Signed)
NAME:  Christina [name], Christina [name] NO.:  [id number]  MEDICAL RECORD NO.:  [id number]  LOCATION:  [facility]                          FACILITY:  [facility]  PHYSICIAN:  Sherron Monday, MD        DATE OF BIRTH:  [birth year]  DATE OF PROCEDURE:  04/09/2012 DATE OF DISCHARGE:                              OPERATIVE REPORT   PREOPERATIVE DIAGNOSES:  Intrauterine pregnancy at term, spontaneous rupture of membranes, failed trial of labor after cesarean section, arrest of dilatation, desires sterilization.  POSTOPERATIVE DIAGNOSES:  Intrauterine pregnancy at term, spontaneous rupture of membranes, failed trial of labor after cesarean section, arrest of dilatation, desires sterilization, delivered.  PROCEDURE:  Repeat low transverse cesarean section with bilateral tubal ligation by [place] method.  SURGEON:  Sherron Monday, MD  ANESTHESIA:  Epidural.  IV FLUIDS:  2000.  EBL:  700.  URINE OUTPUT:  200 mL clear urine at the end of procedure  COMPLICATIONS:  None.  PATHOLOGY:  Bilateral tubal segments as well as placenta to L and D for disposal.  FINDINGS:  Viable female infant at 9:37 with Apgars of 9 at 1 minute and 9 at 5 minutes.  Weight pending at the time of dictation.  Normal postpartum anatomy with normal uterus, tubes, and ovaries.  Some omental to uterine adhesions requiring lysis of adhesions.  PROCEDURE:  After informed consent was reviewed in this case where the patient ruptured and never was able to get into labor.  She was unchanged over approximately 7-8 hours desiring delivery.  Discussed with the patient risks, benefits, alternatives of the surgical procedure including bleeding infection, damage to surrounding organs, injury to baby, and trouble healing.  The patient voiced understanding, wished to proceed.  She was transported to the OR where epidural was dosed.  She was then placed in supine position with a leftward tilt, prepped and draped in the normal sterile fashion.   A Foley catheter was sterilely placed.  A Pfannenstiel incision was made at the level of her previous incision approximately 1 fingerbreadth above the pubic symphysis, carried through the underlying layer of fascia sharply.  The fascia was incised in the midline.  This incision was extended laterally with Mayo scissors.  The inferior aspect of the fascial incision was grasped with Kocher clamps, elevated, and the rectus muscles were dissected off both bluntly and sharply.  Attention was then turned to the superior portion which in a similar fashion was grasped with Kocher clamps, elevated, and the rectus muscles were dissected off both bluntly and sharply.  Midline was easily identified and peritoneum was entered bluntly.  At this time, the utero-omental adhesions were noted and lysed.  The Alexis skin retractor was placed carefully making sure no bowel was entrapped. Vesicouterine peritoneum was easily identified.  A bladder flap was created with Metzenbaum scissors and blunt dissection.  The uterus was incised in a transverse fashion.  Infant was delivered from the vertex presentation with the aid of a vacuum.  The baby's mouth and nose were suctioned on the field.  Cord was clamped and cut.  Infant was handed off to the awaiting pediatric staff.  The placenta was expressed from the uterus.  Uterus was cleared of all clot and debris.  There was noted to be some bogginess, cytotec was rectally dosed after cesarean.  After the uterus had been cleared of all clot and debris, the uterus was closed in 2 layers of 0 Monocryl, the first of which was running locked and second as an imbricating layer.  Bovie cautery was also used to make this area hemostatic.  We again confirmed that the patient desired tubal ligation.  Initially the left tube was identified, followed out to the fimbriated end, elevated with a Babcock.  A window was created in the mesosalpinx with Bovie cautery in the  Parkland method.  The tube was ligated.  The intervening portion was excised and sent to Pathology.  It was noted to be hemostatic.  It was ligated with 0 plain gut.  Attention was noted to be hemostatic.  Attention was then turned to the right which in similar fashion was grasped and elevated with a Babcock.  A window was created in the mesosalpinx.  The tube was ligated in a Parkland fashion.  The intervening portion sent to Pathology. Inspection of tube that had been ligated, was noted to be hemostatic.  The Alexis retractor was removed after omentum had been inspected and found to be hemostatic.  The peritoneum was closed with 2-0 Vicryl in a running fashion.  The subfascial planes were inspected and found to be hemostatic.  Fascia was then reapproximated with 0 Vicryl in a running fashion.  The subcuticular adipose layer was made hemostatic with Bovie cautery, irrigated copiously, and the dead space was closed with 3-0 plain gut.  The skin was closed with 4-0 Vicryl on a Keith needle. Benzoin and Steri-Strips were applied.  The patient tolerated the procedure well.  Sponge, lap, and needle counts were correct x2 per the operating staff, pt tolerated procedure well.  She was transferred to PACU in stable condition.      Sherron Monday, MD     JB/MEDQ  D:  04/09/2012  T:  04/09/2012  Job:  [id number]

## 2012-04-10 NOTE — Progress Notes (Signed)
UR chart review completed.  

## 2012-04-11 NOTE — Progress Notes (Signed)
Subjective: Postpartum Day 2: Cesarean Delivery Patient reports incisional pain and tolerating PO.  Nl lochia, pain controlled.  Objective: Vital signs in last 24 hours: Temp:  [98 F (36.7 C)-98.6 F (37 C)] 98 F (36.7 C) (12/03 0539) Pulse Rate:  [74-97] 74  (12/03 0539) Resp:  [18] 18  (12/03 0539) BP: (106-125)/(70-73) 108/70 mmHg (12/03 0539) SpO2:  [95 %-98 %] 95 % (12/02 1527)  Physical Exam:  General: alert and no distress Lochia: appropriate Uterine Fundus: firm Incision: healing well DVT Evaluation: No evidence of DVT seen on physical exam.   Basename 04/10/12 0511 04/09/12 1115  HGB 10.6* 12.0  HCT 31.6* 36.3    Assessment/Plan: Status post Cesarean section. Doing well postoperatively.  Continue current care.  Likely d/c tomorrow  [name],Christina Folkert 04/11/2012, 8:46 AM

## 2012-04-11 NOTE — Progress Notes (Signed)

## 2012-04-12 ENCOUNTER — Inpatient Hospital Stay (HOSPITAL_COMMUNITY)
Admission: RE | Admit: 2012-04-12 | Payer: Medicaid Other | Source: Ambulatory Visit | Admitting: Obstetrics and Gynecology

## 2012-04-12 ENCOUNTER — Encounter (HOSPITAL_COMMUNITY): Payer: Self-pay | Admitting: Obstetrics and Gynecology

## 2012-04-12 DIAGNOSIS — Z9851 Tubal ligation status: Secondary | ICD-10-CM

## 2012-04-12 HISTORY — DX: Tubal ligation status: Z98.51

## 2012-04-12 SURGERY — Surgical Case
Anesthesia: Choice

## 2012-04-12 MED ORDER — PRENATAL MULTIVITAMIN CH: 1.0000 | ORAL_TABLET | Freq: Every day | ORAL | Status: DC

## 2012-04-12 MED ORDER — OXYCODONE-ACETAMINOPHEN 5-325 MG PO TABS: 1.0000 | ORAL_TABLET | Freq: Four times a day (QID) | ORAL | Status: DC | PRN

## 2012-04-12 MED ORDER — IBUPROFEN 800 MG PO TABS: 800.0000 mg | ORAL_TABLET | Freq: Three times a day (TID) | ORAL | Status: DC | PRN

## 2012-04-12 NOTE — Progress Notes (Signed)
Subjective: Postpartum Day 3: Cesarean Delivery Patient reports incisional pain and tolerating PO.  Pain controlled, nl lochia  Objective: Vital signs in last 24 hours: Temp:  [97.9 F (36.6 C)-98.5 F (36.9 C)] 97.9 F (36.6 C) (12/04 0623) Pulse Rate:  [86-97] 97  (12/04 0623) Resp:  [18] 18  (12/04 0623) BP: (101-109)/(65-70) 101/66 mmHg (12/04 2952)  Physical Exam:  General: alert and no distress Lochia: appropriate Uterine Fundus: firm Incision: healing well DVT Evaluation: No evidence of DVT seen on physical exam.   Basename 04/10/12 0511 04/09/12 1115  HGB 10.6* 12.0  HCT 31.6* 36.3    Assessment/Plan: Status post Cesarean section. Doing well postoperatively.  Discharge home with standard precautions and return to clinic in 2 weeks.  D/C with Motrin, Percocet, PNV.    BOVARD,Diedra Sinor 04/12/2012, 8:52 AM

## 2012-04-12 NOTE — Discharge Summary (Signed)
Obstetric Discharge Summary Reason for Admission: rupture of membranes, TOLAC Prenatal Procedures: none Intrapartum Procedures: cesarean: low cervical, transverse and tubal ligation Postpartum Procedures: none Complications-Operative and Postpartum: none Hemoglobin  Date Value Range Status  04/10/2012 10.6* 12.0 - 15.0 g/dL Final     HCT  Date Value Range Status  04/10/2012 31.6* 36.0 - 46.0 % Final    Physical Exam:  General: alert and no distress Lochia: appropriate Uterine Fundus: firm Incision: healing well DVT Evaluation: No evidence of DVT seen on physical exam.  Discharge Diagnoses: Term Pregnancy-delivered  Discharge Information: Date: 04/12/2012 Activity: pelvic rest Diet: routine Medications: PNV, Ibuprofen and Percocet Condition: stable Instructions: refer to practice specific booklet Discharge to: home Follow-up Information    Follow up with BOVARD,Belissa Kooy, MD. Schedule an appointment as soon as possible for a visit in 2 weeks.   Contact information:   [address] [place] [address] [phone]          Newborn Data: Live born female  Birth Weight: 7 lb (3175 g) APGAR: 9, 9  Home with mother.  BOVARD,Kendyl Festa 04/12/2012, 9:00 AM

## 2012-10-04 ENCOUNTER — Emergency Department (HOSPITAL_COMMUNITY)
Admission: EM | Admit: 2012-10-04 | Discharge: 2012-10-04 | Disposition: A | Discharge status: Home / Self Care | Payer: Self-pay | Source: Home / Self Care | Attending: Family Medicine | Admitting: Family Medicine

## 2012-10-04 ENCOUNTER — Encounter (HOSPITAL_COMMUNITY): Payer: Self-pay | Admitting: Emergency Medicine

## 2012-10-04 DIAGNOSIS — H109 Unspecified conjunctivitis: Secondary | ICD-10-CM

## 2012-10-04 MED ORDER — TOBRAMYCIN 0.3 % OP SOLN: 1.0000 [drp] | Freq: Four times a day (QID) | OPHTHALMIC | Status: DC

## 2012-10-04 NOTE — ED Provider Notes (Signed)
History     CSN: [id number]  Arrival date & time 10/04/12  1840   First MD Initiated Contact with Patient 10/04/12 1933      Chief Complaint  Patient presents with  . Conjunctivitis    left eye pain and irritation. symptoms started today.     (Consider location/radiation/quality/duration/timing/severity/associated sxs/prior treatment) Patient is a 22 y.o. female presenting with conjunctivitis. The history is provided by the patient.  Conjunctivitis This is a new problem. The current episode started 6 to 12 hours ago. The problem has been gradually worsening.    Past Medical History  Diagnosis Date  . Ovarian cyst   . HELLP syndrome   . Depression   . Pregnancy induced hypertension     HELLP syndrome with first pregnancy  . Anxiety     hx of panic attacks  . HELLP syndrome in third trimester 2011  . Headache(784.0)   . Desires VBAC (vaginal birth after cesarean) trial 04/09/2012  . Failed trial of labor with baby delivered 04/09/2012  . S/P cesarean section 04/09/2012  . Vacuum-assisted cesarian delivery, delivered, current hospitalization 04/09/2012  . S/P tubal ligation 04/12/2012    Past Surgical History  Procedure Laterality Date  . Cesarean section    . Cesarean section  04/09/2012    Procedure: CESAREAN SECTION;  Surgeon: Sherron Monday, MD;  Location: [facility];  Service: Obstetrics;  Laterality: N/A;  Repeat cesarean section with delivery of baby boy at [phone].   Apgars 9/9.   Bilateral tubal ligation    Family History  Problem Relation Age of Onset  . Anesthesia problems Neg Hx   . Other Neg Hx   . Thyroid disease Mother   . Hypertension Mother   . Anxiety disorder Mother   . Cancer Maternal Grandmother     History  Substance Use Topics  . Smoking status: Never Smoker   . Smokeless tobacco: Not on file  . Alcohol Use: No    OB History   Grav Para Term Preterm Abortions TAB SAB Ect Mult Living   2 2 1 1      2       Review of Systems  Constitutional:  Negative.   HENT: Negative.   Eyes: Positive for discharge and redness. Negative for photophobia, pain, itching and visual disturbance.    Allergies  Review of patient's allergies indicates no known allergies.  Home Medications   Current Outpatient Rx  Name  Route  Sig  Dispense  Refill  . ibuprofen (ADVIL,MOTRIN) 800 MG tablet   Oral   Take 1 tablet (800 mg total) by mouth every 8 (eight) hours as needed.   40 tablet   1   . oxyCODONE-acetaminophen (PERCOCET/ROXICET) 5-325 MG per tablet   Oral   Take 1-2 tablets by mouth every 6 (six) hours as needed (moderate - severe pain).   40 tablet   0   . Prenatal Vit-Fe Fumarate-FA (PRENATAL MULTIVITAMIN) TABS   Oral   Take 1 tablet by mouth daily.   30 tablet   12   . tobramycin (TOBREX) 0.3 % ophthalmic solution   Left Eye   Place 1 drop into the left eye every 6 (six) hours. After warm soak to eye.   5 mL   0     BP 124/71  Pulse 60  Temp(Src) 98.8 F (37.1 C) (Oral)  Resp 18  SpO2 100%  LMP 09/19/2012  Breastfeeding? No  Physical Exam  Nursing note and vitals reviewed. Constitutional: She is  oriented to person, place, and time. She appears well-developed and well-nourished.  HENT:  Head: Normocephalic.  Right Ear: External ear normal.  Left Ear: External ear normal.  Mouth/Throat: Oropharynx is clear and moist.  Eyes: EOM are normal. Pupils are equal, round, and reactive to light. Right eye exhibits no discharge. Left eye exhibits discharge. Right conjunctiva is not injected. Left conjunctiva is injected.  Neck: Normal range of motion. Neck supple.  Lymphadenopathy:    She has no cervical adenopathy.  Neurological: She is alert and oriented to person, place, and time.  Skin: Skin is warm and dry.    ED Course  Procedures (including critical care time)  Labs Reviewed - No data to display No results found.   1. Conjunctivitis of left eye       MDM         Linna Hoff, MD 10/04/12 1944

## 2012-10-04 NOTE — ED Notes (Signed)
Pt c/o left eye pain and irritation that started today.  Pt denies any other symptoms.  Pt has not tried any otc medications.

## 2012-10-21 ENCOUNTER — Emergency Department (HOSPITAL_COMMUNITY)
Admission: EM | Admit: 2012-10-21 | Discharge: 2012-10-21 | Disposition: A | Discharge status: Home / Self Care | Payer: BC Managed Care – PPO | Attending: Emergency Medicine | Admitting: Emergency Medicine

## 2012-10-21 ENCOUNTER — Encounter (HOSPITAL_COMMUNITY): Payer: Self-pay | Admitting: Family Medicine

## 2012-10-21 DIAGNOSIS — R102 Pelvic and perineal pain: Secondary | ICD-10-CM

## 2012-10-21 DIAGNOSIS — Z87440 Personal history of urinary (tract) infections: Secondary | ICD-10-CM | POA: Insufficient documentation

## 2012-10-21 DIAGNOSIS — R350 Frequency of micturition: Secondary | ICD-10-CM | POA: Insufficient documentation

## 2012-10-21 DIAGNOSIS — M549 Dorsalgia, unspecified: Secondary | ICD-10-CM | POA: Insufficient documentation

## 2012-10-21 DIAGNOSIS — Z3202 Encounter for pregnancy test, result negative: Secondary | ICD-10-CM | POA: Insufficient documentation

## 2012-10-21 DIAGNOSIS — R319 Hematuria, unspecified: Secondary | ICD-10-CM | POA: Insufficient documentation

## 2012-10-21 DIAGNOSIS — Z87442 Personal history of urinary calculi: Secondary | ICD-10-CM | POA: Insufficient documentation

## 2012-10-21 DIAGNOSIS — Z8659 Personal history of other mental and behavioral disorders: Secondary | ICD-10-CM | POA: Insufficient documentation

## 2012-10-21 DIAGNOSIS — N898 Other specified noninflammatory disorders of vagina: Secondary | ICD-10-CM | POA: Insufficient documentation

## 2012-10-21 DIAGNOSIS — R300 Dysuria: Secondary | ICD-10-CM | POA: Insufficient documentation

## 2012-10-21 DIAGNOSIS — Z8742 Personal history of other diseases of the female genital tract: Secondary | ICD-10-CM | POA: Insufficient documentation

## 2012-10-21 DIAGNOSIS — R110 Nausea: Secondary | ICD-10-CM | POA: Insufficient documentation

## 2012-10-21 DIAGNOSIS — R3915 Urgency of urination: Secondary | ICD-10-CM | POA: Insufficient documentation

## 2012-10-21 DIAGNOSIS — Z9851 Tubal ligation status: Secondary | ICD-10-CM | POA: Insufficient documentation

## 2012-10-21 DIAGNOSIS — N926 Irregular menstruation, unspecified: Secondary | ICD-10-CM | POA: Insufficient documentation

## 2012-10-21 DIAGNOSIS — N949 Unspecified condition associated with female genital organs and menstrual cycle: Secondary | ICD-10-CM | POA: Insufficient documentation

## 2012-10-21 LAB — POCT PREGNANCY, URINE: Preg Test, Ur: NEGATIVE

## 2012-10-21 LAB — URINALYSIS, ROUTINE W REFLEX MICROSCOPIC
Glucose, UA: NEGATIVE mg/dL
Protein, ur: NEGATIVE mg/dL
pH: 7 (ref 5.0–8.0)

## 2012-10-21 LAB — WET PREP, GENITAL
Trich, Wet Prep: NONE SEEN
Yeast Wet Prep HPF POC: NONE SEEN

## 2012-10-21 LAB — URINE MICROSCOPIC-ADD ON

## 2012-10-21 MED ORDER — NAPROXEN 500 MG PO TABS: 500.0000 mg | ORAL_TABLET | Freq: Two times a day (BID) | ORAL | Status: DC

## 2012-10-21 NOTE — ED Provider Notes (Signed)
History     CSN: [id number]  Arrival date & time 10/21/12  1120   First MD Initiated Contact with Patient 10/21/12 1136      Chief Complaint  Patient presents with  . Back Pain  . Abdominal Pain    (Consider location/radiation/quality/duration/timing/severity/associated sxs/prior treatment) HPI Christina [name] is a 22 y.o. female who presents to ED with complaint of lower abdominal pain.  States pain began yesterday. States in the lower abdomen radiating around the back. Complaining of urinary frequency and dysuria. States also noted some blood in her urine. Admits to increased vaginal discharge and bad odor. States hx of UTIs and kidney stones. States back pain and abdominal pain is bilateral. No fever, chills, admits to nausea, denies vomiting, denies changes in bowesl. No medications taken. Nothing makes symptoms better or worse. Pain is sharp.     Past Medical History  Diagnosis Date  . Ovarian cyst   . HELLP syndrome   . Depression   . Pregnancy induced hypertension     HELLP syndrome with first pregnancy  . Anxiety     hx of panic attacks  . HELLP syndrome in third trimester 2011  . Headache(784.0)   . Desires VBAC (vaginal birth after cesarean) trial 04/09/2012  . Failed trial of labor with baby delivered 04/09/2012  . S/P cesarean section 04/09/2012  . Vacuum-assisted cesarian delivery, delivered, current hospitalization 04/09/2012  . S/P tubal ligation 04/12/2012    Past Surgical History  Procedure Laterality Date  . Cesarean section    . Cesarean section  04/09/2012    Procedure: CESAREAN SECTION;  Surgeon: Sherron Monday, MD;  Location: [facility];  Service: Obstetrics;  Laterality: N/A;  Repeat cesarean section with delivery of baby boy at [phone].   Apgars 9/9.   Bilateral tubal ligation    Family History  Problem Relation Age of Onset  . Anesthesia problems Neg Hx   . Other Neg Hx   . Thyroid disease Mother   . Hypertension Mother   . Anxiety disorder Mother   .  Cancer Maternal Grandmother     History  Substance Use Topics  . Smoking status: Never Smoker   . Smokeless tobacco: Not on file  . Alcohol Use: No    OB History   Grav Para Term Preterm Abortions TAB SAB Ect Mult Living   2 2 1 1      2       Review of Systems  Constitutional: Negative for fever and chills.  HENT: Negative for neck pain and neck stiffness.   Respiratory: Negative.   Cardiovascular: Negative.   Gastrointestinal: Positive for nausea and abdominal pain. Negative for vomiting, diarrhea, constipation and blood in stool.  Endocrine: Negative for polydipsia and polyphagia.  Genitourinary: Positive for dysuria, urgency, frequency, vaginal discharge and pelvic pain. Negative for flank pain.  Musculoskeletal: Positive for back pain.  Neurological: Negative for weakness and numbness.  All other systems reviewed and are negative.    Allergies  Review of patient's allergies indicates no known allergies.  Home Medications   Current Outpatient Rx  Name  Route  Sig  Dispense  Refill  . ibuprofen (ADVIL,MOTRIN) 800 MG tablet   Oral   Take 1 tablet (800 mg total) by mouth every 8 (eight) hours as needed.   40 tablet   1   . oxyCODONE-acetaminophen (PERCOCET/ROXICET) 5-325 MG per tablet   Oral   Take 1-2 tablets by mouth every 6 (six) hours as needed (moderate - severe  pain).   40 tablet   0   . Prenatal Vit-Fe Fumarate-FA (PRENATAL MULTIVITAMIN) TABS   Oral   Take 1 tablet by mouth daily.   30 tablet   12   . tobramycin (TOBREX) 0.3 % ophthalmic solution   Left Eye   Place 1 drop into the left eye every 6 (six) hours. After warm soak to eye.   5 mL   0     BP 126/78  Pulse 68  Temp(Src) 98.2 F (36.8 C)  Resp 18  SpO2 18%  LMP 10/16/2012  Physical Exam  Nursing note and vitals reviewed. Constitutional: She appears well-developed and well-nourished. No distress.  Eyes: Conjunctivae are normal.  Neck: Neck supple.  Cardiovascular: Normal rate,  regular rhythm and normal heart sounds.   Pulmonary/Chest: Effort normal and breath sounds normal. No respiratory distress. She has no wheezes. She has no rales.  Abdominal: Soft. Bowel sounds are normal. She exhibits no distension. There is tenderness. There is no rebound and no guarding.  RLQ, suprapubic, LLQ abdominal tenderness, mild.   Genitourinary: Vagina normal. No vaginal discharge found.  Normal cervix. No uterine or adnexal tenderness  Musculoskeletal: She exhibits no edema.  Neurological: She is alert.  Skin: Skin is warm and dry.    ED Course  Procedures (including critical care time)  Results for orders placed during the hospital encounter of 10/21/12  WET PREP, GENITAL      Result Value Range   Yeast Wet Prep HPF POC NONE SEEN  NONE SEEN   Trich, Wet Prep NONE SEEN  NONE SEEN   Clue Cells Wet Prep HPF POC NONE SEEN  NONE SEEN   WBC, Wet Prep HPF POC FEW (*) NONE SEEN  URINALYSIS, ROUTINE W REFLEX MICROSCOPIC      Result Value Range   Color, Urine YELLOW  YELLOW   APPearance CLEAR  CLEAR   Specific Gravity, Urine 1.025  1.005 - 1.030   pH 7.0  5.0 - 8.0   Glucose, UA NEGATIVE  NEGATIVE mg/dL   Hgb urine dipstick MODERATE (*) NEGATIVE   Bilirubin Urine NEGATIVE  NEGATIVE   Ketones, ur NEGATIVE  NEGATIVE mg/dL   Protein, ur NEGATIVE  NEGATIVE mg/dL   Urobilinogen, UA 1.0  0.0 - 1.0 mg/dL   Nitrite NEGATIVE  NEGATIVE   Leukocytes, UA TRACE (*) NEGATIVE  URINE MICROSCOPIC-ADD ON      Result Value Range   WBC, UA 3-6  <3 WBC/hpf   RBC / HPF 21-50  <3 RBC/hpf   Bacteria, UA FEW (*) RARE  POCT PREGNANCY, URINE      Result Value Range   Preg Test, Ur NEGATIVE  NEGATIVE   No results found.    1. Pelvic pain       MDM  Pt with lower abdominal discomfort. Denies fever, chills, nausea, vomiting. Pt has had some menustrual irregularity. Pelvic exam unremarkable. Wet prep normal. ua unremarkable. Pt in no distress. Does not want any pain medications at this time.  States her pain is 2/10. Doubt PID, doubt ovarian torsin. No fever, no peritoneal signs, doubt appendicitis or any other surgical abdomen. Will d/c home. Instructed to return if worsening symptoms. Otherwise follow up with pcp.   Filed Vitals:   10/21/12 1132 10/21/12 1222  BP: 126/78   Pulse: 68   Temp: 98.2 F (36.8 C)   Resp: 18   SpO2: 18% 98%           Bunny Lowdermilk A [place]any [place], PA-C  10/21/12 1620 

## 2012-10-21 NOTE — ED Notes (Signed)
Per pt sts lower back pain radiating around to abdomen. sts urinary frequency and hematuria. sts hx of kidney stones.

## 2012-10-21 NOTE — ED Provider Notes (Signed)
Medical screening examination/treatment/procedure(s) were performed by non-physician practitioner and as supervising physician I was immediately available for consultation/collaboration.   Marilene Vath, MD 10/21/12 1643 

## 2012-10-22 LAB — URINE CULTURE: Colony Count: >=100000

## 2012-10-23 LAB — GC/CHLAMYDIA PROBE AMP: GC Probe RNA: NEGATIVE

## 2013-01-09 IMAGING — US US ABDOMEN COMPLETE
1 series · 14 of 25 positions shown · non-contrast
Comparison: None.

CLINICAL DATA: Back pain abdominal pain.  33 weeks pregnant.

COMPLETE ABDOMINAL ULTRASOUND

[Series 1: us abdomen complete · 14 of 85 slices shown]
[im 1/85]
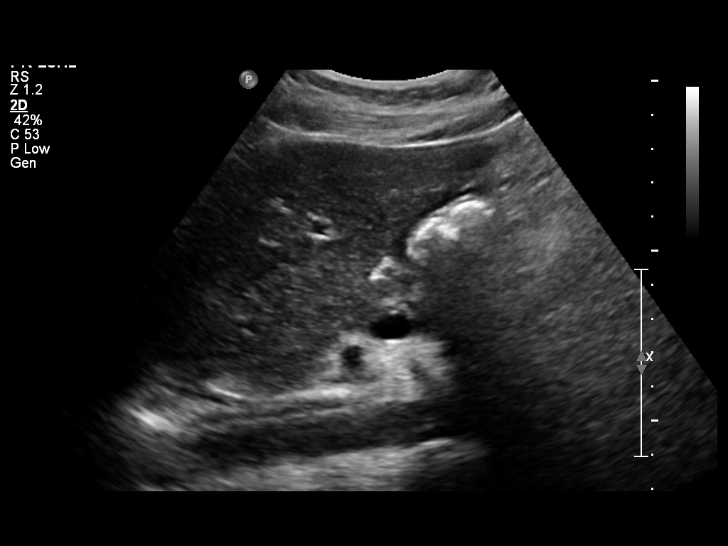
[im 8/85]
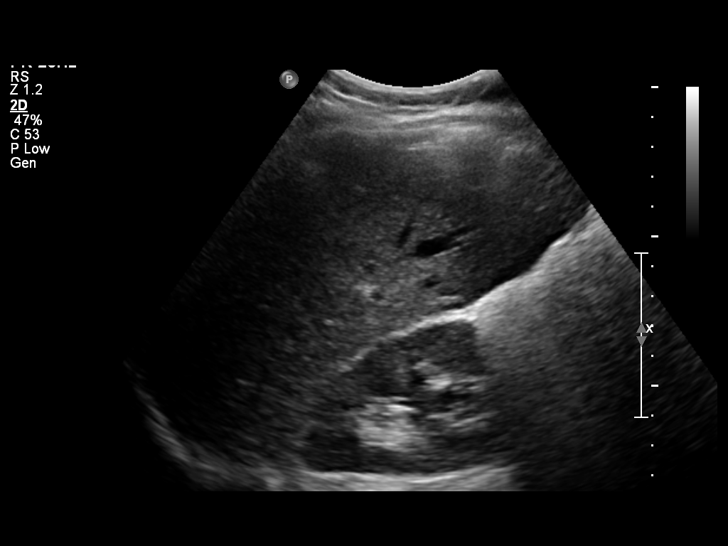
[im 15/85]
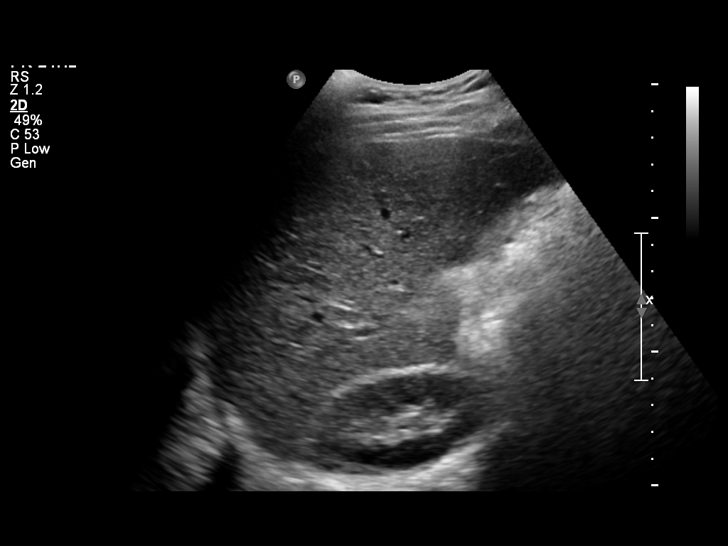
[im 22/85]
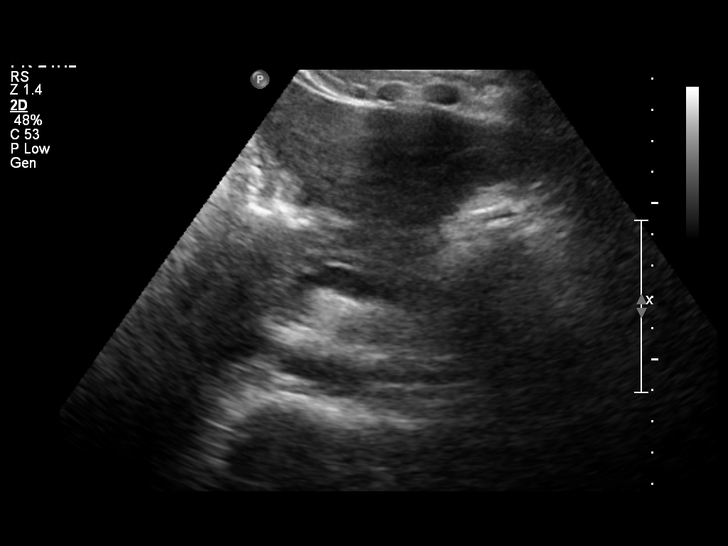
[im 29/85]
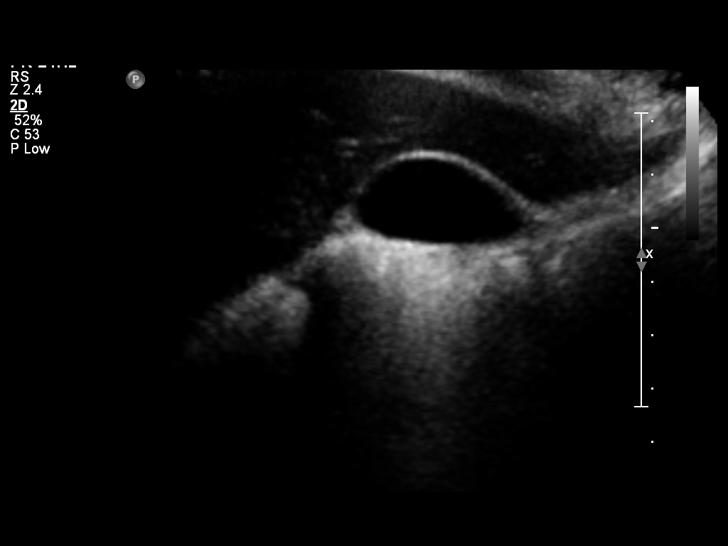
[im 32/85]
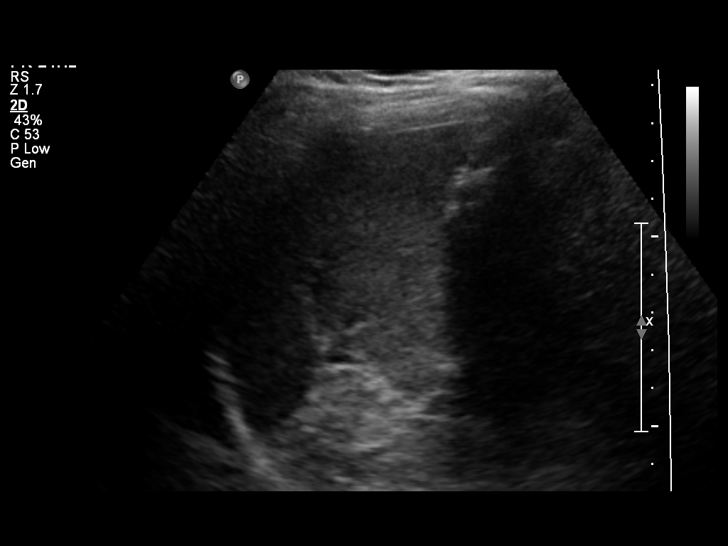
[im 39/85]
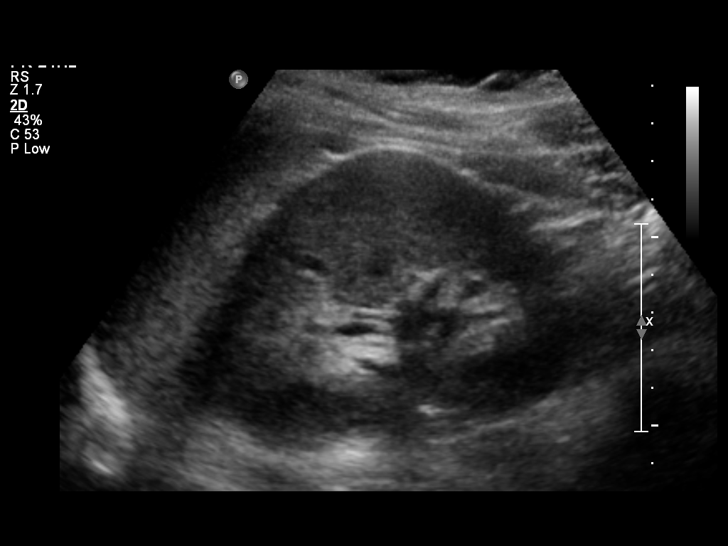
[im 46/85]
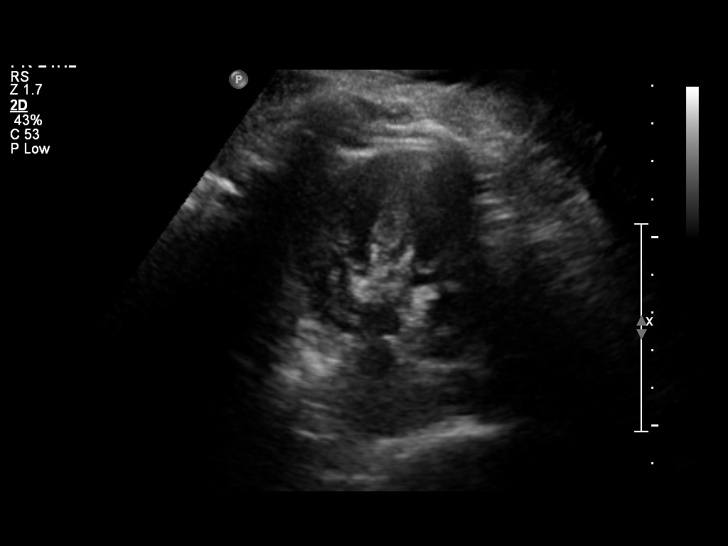
[im 53/85]
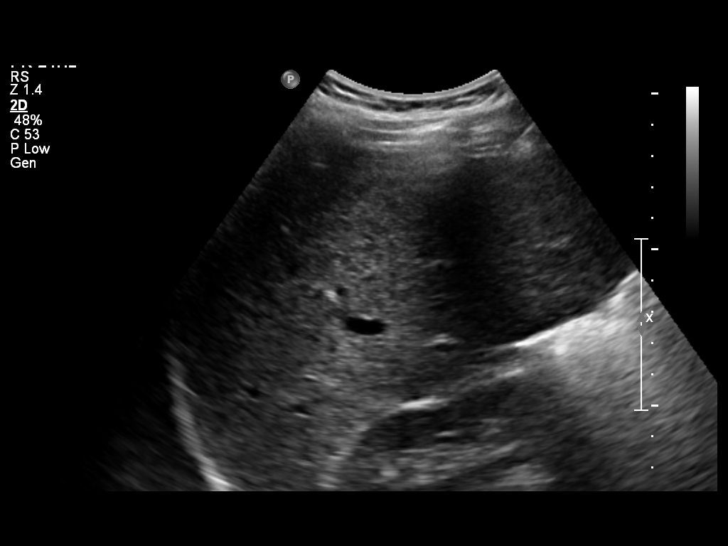
[im 57/85]
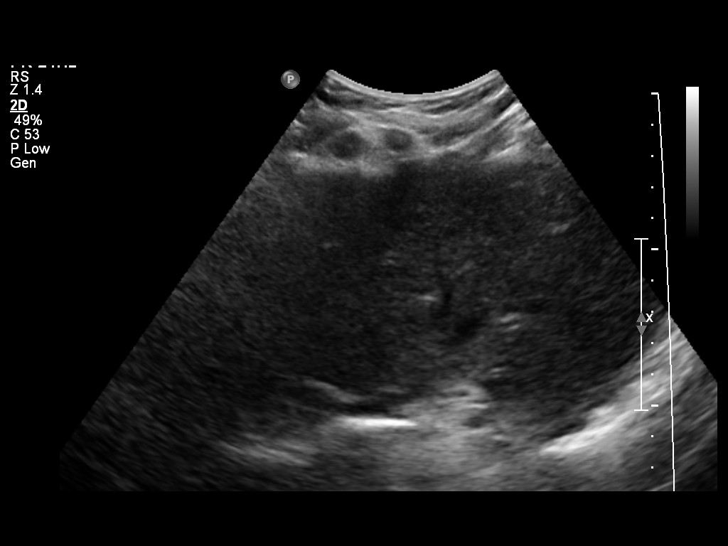
[im 64/85]
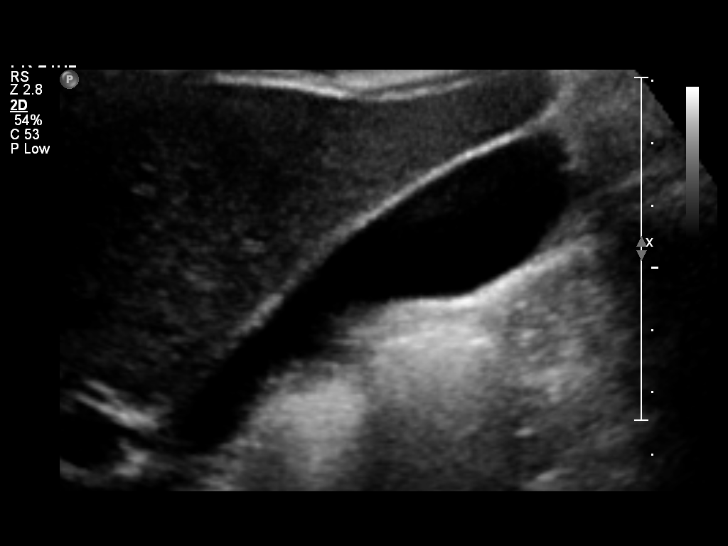
[im 71/85]
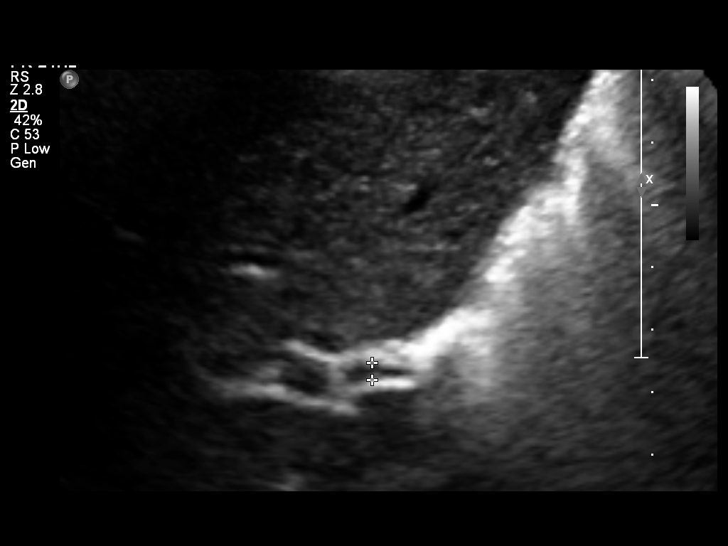
[im 78/85]
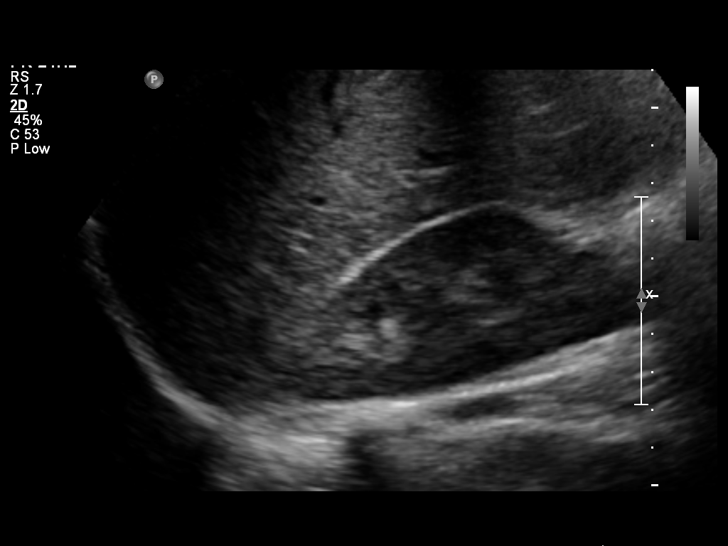
[im 85/85]
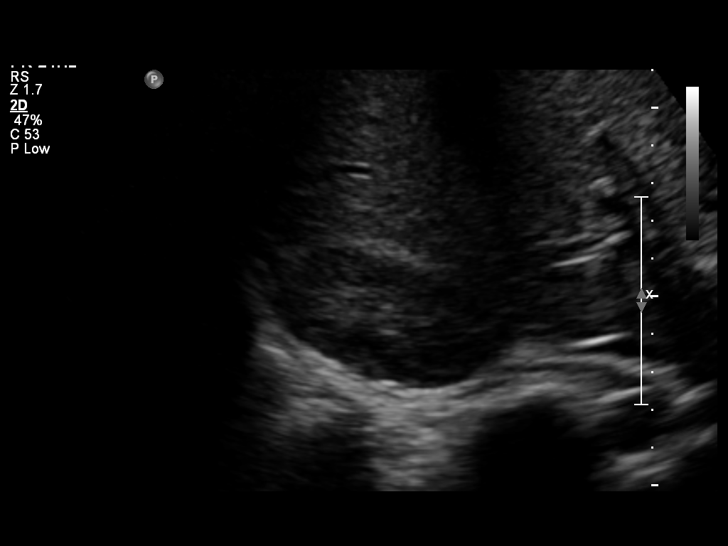

[14 of 25 positions shown; findings below may reference images not displayed]

FINDINGS: Gallbladder:  No gallstones, gallbladder wall thickening, or
pericholecystic fluid.

Common bile duct:  Normal caliber with measured diameter of 2.7 mm.

Liver:  No focal lesion identified.  Within normal limits in
parenchymal echogenicity.

IVC:  Appears normal.

Pancreas:  The pancreas is mostly obscured by overlying bowel gas
and is not well visualized.

Spleen:  Spleen length measures 7 cm.  Normal parenchymal
echotexture.

Right Kidney:  Right kidney measures 9.7 cm length.  No
hydronephrosis.

Left Kidney:  Left kidney measures 10.9 cm length.  No
hydronephrosis.

Abdominal aorta:  Limited visualization.  No aneurysm identified in
the visualized upper abdominal aorta.
IMPRESSION: Negative abdominal ultrasound.

## 2013-11-05 ENCOUNTER — Emergency Department (INDEPENDENT_AMBULATORY_CARE_PROVIDER_SITE_OTHER)
Admission: EM | Admit: 2013-11-05 | Discharge: 2013-11-05 | Disposition: A | Discharge status: Home / Self Care | Payer: BC Managed Care – PPO | Source: Home / Self Care | Attending: Family Medicine | Admitting: Family Medicine

## 2013-11-05 ENCOUNTER — Encounter (HOSPITAL_COMMUNITY): Payer: Self-pay | Admitting: Emergency Medicine

## 2013-11-05 DIAGNOSIS — J340 Abscess, furuncle and carbuncle of nose: Secondary | ICD-10-CM

## 2013-11-05 DIAGNOSIS — L0202 Furuncle of face: Secondary | ICD-10-CM

## 2013-11-05 DIAGNOSIS — L0203 Carbuncle of face: Secondary | ICD-10-CM

## 2013-11-05 MED ORDER — MUPIROCIN CALCIUM 2 % NA OINT: TOPICAL_OINTMENT | NASAL | Status: DC

## 2013-11-05 MED ORDER — AMOXICILLIN 500 MG PO CAPS: 500.0000 mg | ORAL_CAPSULE | Freq: Three times a day (TID) | ORAL | Status: DC

## 2013-11-05 NOTE — ED Provider Notes (Signed)
CSN: [id number]     Arrival date & time 11/05/13  1937 History   First MD Initiated Contact with Patient 11/05/13 2021     Chief Complaint  Patient presents with  . Facial Pain   (Consider location/radiation/quality/duration/timing/severity/associated sxs/prior Treatment) HPI Comments: Presents with one week history of pain in her right nostril without associated injury or fever. States it is sore and feels as if someone has hit her in the nose. No fever, chills, epistaxis. No facial swelling or pain with eye movements. No dental issues.  PCP: none  The history is provided by the patient.    Past Medical History  Diagnosis Date  . Ovarian cyst   . HELLP syndrome   . Depression   . Pregnancy induced hypertension     HELLP syndrome with first pregnancy  . Anxiety     hx of panic attacks  . HELLP syndrome in third trimester 2011  . Headache(784.0)   . Desires VBAC (vaginal birth after cesarean) trial 04/09/2012  . Failed trial of labor with baby delivered 04/09/2012  . S/P cesarean section 04/09/2012  . Vacuum-assisted cesarian delivery, delivered, current hospitalization 04/09/2012  . S/P tubal ligation 04/12/2012   Past Surgical History  Procedure Laterality Date  . Cesarean section    . Cesarean section  04/09/2012    Procedure: CESAREAN SECTION;  Surgeon: Thornell Sartorius, MD;  Location: [facility];  Service: Obstetrics;  Laterality: N/A;  Repeat cesarean section with delivery of baby boy at [phone].   Apgars 9/9.   Bilateral tubal ligation   Family History  Problem Relation Age of Onset  . Anesthesia problems Neg Hx   . Other Neg Hx   . Thyroid disease Mother   . Hypertension Mother   . Anxiety disorder Mother   . Cancer Maternal Grandmother    History  Substance Use Topics  . Smoking status: Never Smoker   . Smokeless tobacco: Not on file  . Alcohol Use: No   OB History   Grav Para Term Preterm Abortions TAB SAB Ect Mult Living   2 2 1 1      2      Review of Systems  All  other systems reviewed and are negative.   Allergies  Review of patient's allergies indicates no known allergies.  Home Medications   Prior to Admission medications   Medication Sig Start Date End Date Taking? Authorizing Graig Hessling  amoxicillin (AMOXIL) 500 MG capsule Take 1 capsule (500 mg total) by mouth 3 (three) times daily. 11/05/13   Lahoma Rocker, PA  mupirocin nasal ointment (BACTROBAN) 2 % Apply in each nostril BID x 7 days 11/05/13   Lahoma Rocker, PA  naproxen (NAPROSYN) 500 MG tablet Take 1 tablet (500 mg total) by mouth 2 (two) times daily with a meal. 10/21/12   Tatyana A Kirichenko, PA-C   BP 121/73  Pulse 75  Temp(Src) 98.1 F (36.7 C) (Oral)  Resp 16  SpO2 98%  LMP 10/27/2013 Physical Exam  Nursing note and vitals reviewed. Constitutional: She is oriented to person, place, and time. She appears well-developed and well-nourished. No distress.  HENT:  Head: Normocephalic and atraumatic.  Right Ear: Hearing, tympanic membrane, external ear and ear canal normal.  Left Ear: Hearing, tympanic membrane, external ear and ear canal normal.  Nose: Sinus tenderness present. No rhinorrhea, nose lacerations, nasal deformity or nasal septal hematoma. No epistaxis.  No foreign bodies. Right sinus exhibits no maxillary sinus tenderness and no frontal sinus tenderness.  Left sinus exhibits no maxillary sinus tenderness and no frontal sinus tenderness.  Mouth/Throat: Uvula is midline, oropharynx is clear and moist and mucous membranes are normal. No oral lesions. No trismus in the jaw. Normal dentition. No uvula swelling.  +furuncle in right nostril along lateral wall of nostril.   Eyes: Conjunctivae are normal. No scleral icterus.  Neck: Normal range of motion. Neck supple.  +Mild tender shotty anterior cervical lymphadenopathy on right.  Cardiovascular: Normal rate, regular rhythm and normal heart sounds.   Pulmonary/Chest: Effort normal and breath sounds normal.   Musculoskeletal: Normal range of motion.  Neurological: She is alert and oriented to person, place, and time.  Skin: Skin is warm and dry. No rash noted. No erythema.  Psychiatric: She has a normal mood and affect. Her behavior is normal.    ED Course  Procedures (including critical care time) Labs Review Labs Reviewed - No data to display  Imaging Review No results found.   MDM   1. Furuncle of nasal cavity   Will treat with warm compresses, topical nasal bactroban and oral amoxicillin and advise follow up if no improvement.     [place], [place] 11/06/13 1800

## 2013-11-05 NOTE — ED Notes (Signed)
C/o nasal pain States feels as if "someone hit her in her nose" States she does have pain neck and top of gums States she thinks it her allergies since she gets a burning sensation in her right side nasal area  No tx tried

## 2013-11-05 NOTE — Discharge Instructions (Signed)
Facial Infection °You have an infection of your face. This requires special attention to help prevent serious problems. Infections in facial wounds can cause poor healing and scars. They can also spread to deeper tissues, especially around the eye. Wound and dental infections can lead to sinusitis, infection of the eye socket, and even meningitis. Permanent damage to the skin, eye, and nervous system may result if facial infections are not treated properly. With severe infections, hospital care for IV antibiotic injections may be needed if they don't respond to oral antibiotics. °Antibiotics must be taken for the full course to insure the infection is eliminated. If the infection came from a bad tooth, it may have to be extracted when the infection is under control. Warm compresses may be applied to reduce skin irritation and remove drainage. °You might need a tetanus shot now if: °· You cannot remember when your last tetanus shot was. °· You have never had a tetanus shot. °· The object that caused your wound was dirty. °If you need a tetanus shot, and you decide not to get one, there is a rare chance of getting tetanus. Sickness from tetanus can be serious. If you got a tetanus shot, your arm may swell, get red and warm to the touch at the shot site. This is common and not a problem. °SEEK IMMEDIATE MEDICAL CARE IF:  °· You have increased swelling, redness, or trouble breathing. °· You have a severe headache, dizziness, nausea, or vomiting. °· You develop problems with your eyesight. °· You have a fever. °Document Released: 06/03/2004 Document Revised: 07/19/2011 Document Reviewed: 04/26/2005 °ExitCare® Patient Information ©2015 [employer]. This information is not intended to replace advice given to you by your health care provider. Make sure you discuss any questions you have with your health care provider. ° °

## 2013-11-09 NOTE — ED Provider Notes (Signed)
Medical screening examination/treatment/procedure(s) were performed by resident physician or non-physician practitioner and as supervising physician I was immediately available for consultation/collaboration.   Pauline Good MD.   Billy Fischer, MD 11/09/13 1336

## 2014-03-11 ENCOUNTER — Encounter (HOSPITAL_COMMUNITY): Payer: Self-pay | Admitting: Emergency Medicine

## 2014-03-27 ENCOUNTER — Inpatient Hospital Stay (HOSPITAL_COMMUNITY)
Admission: AD | Admit: 2014-03-27 | Discharge: 2014-03-27 | Disposition: A | Discharge status: Home / Self Care | Payer: BC Managed Care – PPO | Source: Ambulatory Visit | Attending: Obstetrics and Gynecology | Admitting: Obstetrics and Gynecology

## 2014-03-27 ENCOUNTER — Encounter (HOSPITAL_COMMUNITY): Payer: Self-pay

## 2014-03-27 DIAGNOSIS — N940 Mittelschmerz: Secondary | ICD-10-CM | POA: Diagnosis not present

## 2014-03-27 DIAGNOSIS — R109 Unspecified abdominal pain: Secondary | ICD-10-CM | POA: Diagnosis present

## 2014-03-27 DIAGNOSIS — N949 Unspecified condition associated with female genital organs and menstrual cycle: Secondary | ICD-10-CM | POA: Insufficient documentation

## 2014-03-27 DIAGNOSIS — R102 Pelvic and perineal pain: Secondary | ICD-10-CM | POA: Diagnosis not present

## 2014-03-27 LAB — URINALYSIS, ROUTINE W REFLEX MICROSCOPIC
Bilirubin Urine: NEGATIVE
GLUCOSE, UA: NEGATIVE mg/dL
Hgb urine dipstick: NEGATIVE
Ketones, ur: NEGATIVE mg/dL
LEUKOCYTES UA: NEGATIVE
Nitrite: NEGATIVE
PH: 8.5 — AB (ref 5.0–8.0)
Protein, ur: NEGATIVE mg/dL
Specific Gravity, Urine: 1.015 (ref 1.005–1.030)
Urobilinogen, UA: 0.2 mg/dL (ref 0.0–1.0)

## 2014-03-27 LAB — POCT PREGNANCY, URINE: PREG TEST UR: NEGATIVE

## 2014-03-27 LAB — WET PREP, GENITAL
Clue Cells Wet Prep HPF POC: NONE SEEN
TRICH WET PREP: NONE SEEN
Yeast Wet Prep HPF POC: NONE SEEN

## 2014-03-27 NOTE — Discharge Instructions (Signed)
-   continue ibuprofen  -Leave the nuvaRing out -Call your Dr. On Day 1 of your menstrual cycle to get an appointment to discuss placement of NuvaRing

## 2014-03-27 NOTE — MAU Note (Signed)
Pt presents complaining of abdominal pain and pelvic pressure that started around 950am. States she put in a NuvaRing for the first time last night and then the symptoms started this am. Feels like "something is about to fall out." Pt states she is having pain with bowel movements and pain and pressure while urinating. Hx of BTL, using nuvaring to control periods and cramping with them. Denies vaginal bleeding or discharge.

## 2014-03-27 NOTE — MAU Provider Note (Signed)
History     CSN: [id number]  Arrival date and time: 03/27/14 1059   First Provider Initiated Contact with Patient 03/27/14 1212      Chief Complaint  Patient presents with  . Abdominal Pain   HPI    Ms. Christina [name] is a 23 y.o. female [phone] who presents to MAU for complaints of abdominal pain and pressure. The pain started around 10 am this morning. At the onset of pain she took 800 mg of ibuprofen and feels that it is helping now. She had an US done in the office on Monday to rule out endometriosis per Dr. Melba Coon.  She was instructed to start birth control and the patient chose NuvaRing as her birthcontrol method.  She put the nuva ring in last night and started having pain this morning. The nuva ring is still in place. Marland Kitchen LMP: 03/11/2014  OB History    Gravida Para Term Preterm AB TAB SAB Ectopic Multiple Living   2 2 1 1      2       Past Medical History  Diagnosis Date  . Ovarian cyst   . HELLP syndrome   . Depression   . Pregnancy induced hypertension     HELLP syndrome with first pregnancy  . Anxiety     hx of panic attacks  . HELLP syndrome in third trimester 2011  . Headache(784.0)   . Desires VBAC (vaginal birth after cesarean) trial 04/09/2012  . Failed trial of labor with baby delivered 04/09/2012  . S/P cesarean section 04/09/2012  . Vacuum-assisted cesarian delivery, delivered, current hospitalization 04/09/2012  . S/P tubal ligation 04/12/2012    Past Surgical History  Procedure Laterality Date  . Cesarean section    . Cesarean section  04/09/2012    Procedure: CESAREAN SECTION;  Surgeon: Thornell Sartorius, MD;  Location: [facility];  Service: Obstetrics;  Laterality: N/A;  Repeat cesarean section with delivery of baby boy at [phone].   Apgars 9/9.   Bilateral tubal ligation    Family History  Problem Relation Age of Onset  . Anesthesia problems Neg Hx   . Other Neg Hx   . Thyroid disease Mother   . Hypertension Mother   . Anxiety disorder Mother   . Cancer  Maternal Grandmother     History  Substance Use Topics  . Smoking status: Never Smoker   . Smokeless tobacco: Not on file  . Alcohol Use: No    Allergies: Not on File  Prescriptions prior to admission  Medication Sig Dispense Refill Last Dose  . ibuprofen (ADVIL,MOTRIN) 200 MG tablet Take 200 mg by mouth every 6 (six) hours as needed for moderate pain.   03/27/2014 at Unknown time  . amoxicillin (AMOXIL) 500 MG capsule Take 1 capsule (500 mg total) by mouth 3 (three) times daily. (Patient not taking: Reported on 03/27/2014) 21 capsule 0   . mupirocin nasal ointment (BACTROBAN) 2 % Apply in each nostril BID x 7 days (Patient not taking: Reported on 03/27/2014) 1 g 1   . naproxen (NAPROSYN) 500 MG tablet Take 1 tablet (500 mg total) by mouth 2 (two) times daily with a meal. (Patient not taking: Reported on 03/27/2014) 30 tablet 0    Results for orders placed or performed during the hospital encounter of 03/27/14 (from the past 48 hour(s))  Urinalysis, Routine w reflex microscopic     Status: Abnormal   Collection Time: 03/27/14 11:05 AM  Result Value Ref Range   Color, Urine  YELLOW YELLOW   APPearance CLEAR CLEAR   Specific Gravity, Urine 1.015 1.005 - 1.030   pH 8.5 (H) 5.0 - 8.0   Glucose, UA NEGATIVE NEGATIVE mg/dL   Hgb urine dipstick NEGATIVE NEGATIVE   Bilirubin Urine NEGATIVE NEGATIVE   Ketones, ur NEGATIVE NEGATIVE mg/dL   Protein, ur NEGATIVE NEGATIVE mg/dL   Urobilinogen, UA 0.2 0.0 - 1.0 mg/dL   Nitrite NEGATIVE NEGATIVE   Leukocytes, UA NEGATIVE NEGATIVE    Comment: MICROSCOPIC NOT DONE ON URINES WITH NEGATIVE PROTEIN, BLOOD, LEUKOCYTES, NITRITE, OR GLUCOSE <1000 mg/dL.  Pregnancy, urine POC     Status: None   Collection Time: 03/27/14 11:15 AM  Result Value Ref Range   Preg Test, Ur NEGATIVE NEGATIVE    Comment:        THE SENSITIVITY OF THIS METHODOLOGY IS >24 mIU/mL   Wet prep, genital     Status: Abnormal   Collection Time: 03/27/14 12:25 PM  Result Value  Ref Range   Yeast Wet Prep HPF POC NONE SEEN NONE SEEN   Trich, Wet Prep NONE SEEN NONE SEEN   Clue Cells Wet Prep HPF POC NONE SEEN NONE SEEN   WBC, Wet Prep HPF POC FEW (A) NONE SEEN    Comment: FEW BACTERIA SEEN    Review of Systems  Constitutional: Negative for fever and chills.  Gastrointestinal: Positive for nausea and abdominal pain (Bilateral lower abdominal pressure ).  Genitourinary:       Vaginal pressure.    Physical Exam   Blood pressure 137/71, pulse 87, temperature 98 F (36.7 C), temperature source Oral, resp. rate 18, last menstrual period 03/11/2014.  Physical Exam  Constitutional: She is oriented to person, place, and time. She appears well-developed and well-nourished. No distress.  HENT:  Head: Normocephalic.  Eyes: Pupils are equal, round, and reactive to light.  Neck: Neck supple.  Cardiovascular: Normal rate.   Respiratory: Effort normal.  GI: Soft. Normal appearance. There is tenderness in the suprapubic area.  Genitourinary:  Speculum exam: Vagina - Small amount of creamy discharge, no odor NuvaRing in the vagina Cervix - No contact bleeding Bimanual exam: Cervix closed Uterus non tender, normal size Adnexa non tender, no masses bilaterally, +suprapubic tenderness  Wet prep done Chaperone present for exam.   Musculoskeletal: Normal range of motion.  Neurological: She is alert and oriented to person, place, and time.  Skin: Skin is warm. She is not diaphoretic.  Psychiatric: Her behavior is normal.    MAU Course  Procedures  None   MDM Consulted with Dr. Marvel Plan at 12:25 NuvaRing out Patient not having any pain at this time.  Urine culture pending   Assessment and Plan   A: 1. Pelvic pain in female   2. [place]    P: Discharge home in stable condition Call the office on day 1 of menstrual cycle to discuss NuvaRing insertion and or other BC options Return to MAU if symptoms worsen  Follow up with PCP as scheduled   Take ibuprofen as directed on the bottle   [place], NP 03/27/2014 2:51 PM

## 2014-03-28 LAB — GC/CHLAMYDIA PROBE AMP
CT PROBE, AMP APTIMA: NEGATIVE
GC PROBE AMP APTIMA: NEGATIVE

## 2014-06-08 ENCOUNTER — Encounter (HOSPITAL_COMMUNITY): Payer: Self-pay | Admitting: Emergency Medicine

## 2014-06-08 ENCOUNTER — Emergency Department (HOSPITAL_COMMUNITY)
Admission: EM | Admit: 2014-06-08 | Discharge: 2014-06-08 | Disposition: A | Discharge status: Home / Self Care | Payer: BLUE CROSS/BLUE SHIELD | Attending: Emergency Medicine | Admitting: Emergency Medicine

## 2014-06-08 DIAGNOSIS — Z9889 Other specified postprocedural states: Secondary | ICD-10-CM | POA: Diagnosis not present

## 2014-06-08 DIAGNOSIS — Z8742 Personal history of other diseases of the female genital tract: Secondary | ICD-10-CM | POA: Insufficient documentation

## 2014-06-08 DIAGNOSIS — Z8659 Personal history of other mental and behavioral disorders: Secondary | ICD-10-CM | POA: Insufficient documentation

## 2014-06-08 DIAGNOSIS — J029 Acute pharyngitis, unspecified: Secondary | ICD-10-CM | POA: Diagnosis not present

## 2014-06-08 DIAGNOSIS — Z9851 Tubal ligation status: Secondary | ICD-10-CM | POA: Insufficient documentation

## 2014-06-08 LAB — RAPID STREP SCREEN (MED CTR MEBANE ONLY): STREPTOCOCCUS, GROUP A SCREEN (DIRECT): NEGATIVE

## 2014-06-08 MED ORDER — DEXAMETHASONE 4 MG PO TABS
12.0000 mg | ORAL_TABLET | Freq: Once | ORAL | Status: AC
  Administered 2014-06-08: 12 mg via ORAL
  Filled 2014-06-08: qty 3

## 2014-06-08 NOTE — ED Provider Notes (Signed)
CSN: [id number]     Arrival date & time 06/08/14  0623 History   First MD Initiated Contact with Patient 06/08/14 [phone]     Chief Complaint  Patient presents with  . Sore Throat    pain with swallow     (Consider location/radiation/quality/duration/timing/severity/associated sxs/prior Treatment) Patient is a 24 y.o. female presenting with pharyngitis. The history is provided by the patient.  Sore Throat This is a new problem. The current episode started more than 2 days ago. The problem occurs constantly. The problem has not changed since onset.Pertinent negatives include no abdominal pain and no shortness of breath. Nothing aggravates the symptoms. Nothing relieves the symptoms. She has tried nothing for the symptoms.    Past Medical History  Diagnosis Date  . Ovarian cyst   . HELLP syndrome   . Depression   . Pregnancy induced hypertension     HELLP syndrome with first pregnancy  . Anxiety     hx of panic attacks  . HELLP syndrome in third trimester 2011  . Headache(784.0)   . Desires VBAC (vaginal birth after cesarean) trial 04/09/2012  . Failed trial of labor with baby delivered 04/09/2012  . S/P cesarean section 04/09/2012  . Vacuum-assisted cesarian delivery, delivered, current hospitalization 04/09/2012  . S/P tubal ligation 04/12/2012   Past Surgical History  Procedure Laterality Date  . Cesarean section    . Cesarean section  04/09/2012    Procedure: CESAREAN SECTION;  Surgeon: Thornell Sartorius, MD;  Location: [facility];  Service: Obstetrics;  Laterality: N/A;  Repeat cesarean section with delivery of baby boy at [phone].   Apgars 9/9.   Bilateral tubal ligation   Family History  Problem Relation Age of Onset  . Anesthesia problems Neg Hx   . Other Neg Hx   . Thyroid disease Mother   . Hypertension Mother   . Anxiety disorder Mother   . Cancer Maternal Grandmother    History  Substance Use Topics  . Smoking status: Never Smoker   . Smokeless tobacco: Not on file  . Alcohol  Use: No   OB History    Gravida Para Term Preterm AB TAB SAB Ectopic Multiple Living   2 2 1 1      2      Review of Systems  Constitutional: Negative for fever.  HENT: Positive for congestion, rhinorrhea and sore throat. Negative for dental problem and mouth sores.   Respiratory: Negative for cough and shortness of breath.   Gastrointestinal: Negative for vomiting and abdominal pain.  All other systems reviewed and are negative.     Allergies  Review of patient's allergies indicates no known allergies.  Home Medications   Prior to Admission medications   Medication Sig Start Date End Date Taking? Authorizing Provider  ibuprofen (ADVIL,MOTRIN) 200 MG tablet Take 200 mg by mouth every 6 (six) hours as needed for moderate pain.   Yes Historical Provider, MD   BP 119/68 mmHg  Pulse 70  Temp(Src) 98.9 F (37.2 C) (Oral)  Resp 18  Ht 5\' 7"  (1.702 m)  Wt 195 lb (88.451 kg)  BMI 30.53 kg/m2  SpO2 100%  LMP 06/02/2014 Physical Exam  Constitutional: She is oriented to person, place, and time. She appears well-developed and well-nourished. No distress.  HENT:  Head: Normocephalic and atraumatic.  Mouth/Throat: No oropharyngeal exudate.  Minimal posterior erythema  Eyes: EOM are normal. Pupils are equal, round, and reactive to light.  Neck: Normal range of motion. Neck supple.  Cardiovascular: Normal  rate and regular rhythm.  Exam reveals no friction rub.   No murmur heard. Pulmonary/Chest: Effort normal and breath sounds normal. No respiratory distress. She has no wheezes. She has no rales.  Abdominal: Soft. She exhibits no distension. There is no tenderness. There is no rebound.  Musculoskeletal: Normal range of motion. She exhibits no edema.  Neurological: She is alert and oriented to person, place, and time.  Skin: No rash noted. She is not diaphoretic.  Nursing note and vitals reviewed.   ED Course  Procedures (including critical care time) Labs Review Labs Reviewed   RAPID STREP SCREEN  CULTURE, GROUP A STREP    Imaging Review No results found.   EKG Interpretation None      MDM   Final diagnoses:  Pharyngitis    22F here with sore throat, painful swallowing. Present for past few days. Child sick with similar symptoms. No fevers. AFVSS here. Minimal throat erythema, no exudates, no lymphadenopathy. Patient is strep negative. Given a dose of decadron for symptom control, instructed to continue supportive care. Stable for discharge.    Evelina Bucy, MD 06/08/14 [phone]

## 2014-06-08 NOTE — Discharge Instructions (Signed)

## 2014-06-08 NOTE — ED Notes (Signed)
Has felt sick, sore throat for a couple of weeks but worse in past 3 days.  States its harder to swallow at night and it also hurts to do the same.  No overt redness noted in oral pharynx, tonsils appear to me wnl.  Breath sounds wnl, no drooling or stridor noted.  Vss, denies fevers and chills.

## 2014-06-11 LAB — CULTURE, GROUP A STREP

## 2014-09-03 ENCOUNTER — Ambulatory Visit (INDEPENDENT_AMBULATORY_CARE_PROVIDER_SITE_OTHER): Payer: BLUE CROSS/BLUE SHIELD | Admitting: Internal Medicine

## 2014-09-03 VITALS — BP 130/72 | HR 101 | Temp 98.9°F | Resp 20 | Ht 66.75 in | Wt 197.5 lb

## 2014-09-03 DIAGNOSIS — M791 Myalgia, unspecified site: Secondary | ICD-10-CM

## 2014-09-03 DIAGNOSIS — M2550 Pain in unspecified joint: Secondary | ICD-10-CM | POA: Diagnosis not present

## 2014-09-03 DIAGNOSIS — R350 Frequency of micturition: Secondary | ICD-10-CM

## 2014-09-03 DIAGNOSIS — R509 Fever, unspecified: Secondary | ICD-10-CM | POA: Diagnosis not present

## 2014-09-03 LAB — POCT UA - MICROSCOPIC ONLY
CASTS, UR, LPF, POC: NEGATIVE
CRYSTALS, UR, HPF, POC: NEGATIVE
Mucus, UA: NEGATIVE
Yeast, UA: NEGATIVE

## 2014-09-03 LAB — POCT URINALYSIS DIPSTICK
Bilirubin, UA: NEGATIVE
Blood, UA: NEGATIVE
Glucose, UA: NEGATIVE
KETONES UA: NEGATIVE
Leukocytes, UA: NEGATIVE
Nitrite, UA: NEGATIVE
PH UA: 7.5
Protein, UA: NEGATIVE
SPEC GRAV UA: 1.02
UROBILINOGEN UA: 0.2

## 2014-09-03 MED ORDER — MELOXICAM 15 MG PO TABS: 15.0000 mg | ORAL_TABLET | Freq: Every day | ORAL | Status: AC

## 2014-09-03 NOTE — Progress Notes (Signed)
Subjective:    Patient ID: Christina [name], female    DOB: October 11, 1990, 24 y.o.   MRN: [id number]  This chart was scribed for Leandrew Koyanagi, MD by Stephania Fragmin, ED Scribe. This patient was seen in room 12 and the patient's care was started at 9:01 PM.   HPI  Chief Complaint  Patient presents with  . Nasal Congestion  . Pain in Joints    x 1 year-Skin in sensitive to touch    HPI Comments: Christina [name] is a 24 y.o. female who presents to the [facility] complaining of nasal congestion that began yesterday. She also had fever that lasted for 12 hours yesterday, but it has since resolved. She does note she had been having "yellow snot" all month, but her recent infection seems to have cleared that. She denies a history of seasonal allergies. Patient has had sick contact with her husband, who had a cough for 2-3 days.   Patient also complains of having intermittent burning pain which started in her joints in the past year but has spread to her back, neck, joints, and breast recently. She describes this as feeling like a "sensitivity," and her skin is sensitive to touch. She is unsure whether her joints are swollen or erythematous. She had seen her PCP for her joint problem, but she did not have any labs done. Patient has had to excuse herself from work twice in the past 4 months due to the joint pain. Patient works at a call center. She used to frequent the gym and had lost 40 pounds a while back, but gained 15 pounds of it back due to her sedentary job.  Patient endorses some urinary frequency and pelvic pain when urinating, although she states this is baseline. She usually gets up once a night to urinate.   Past Medical History  Diagnosis Date  . Ovarian cyst   . HELLP syndrome   . Depression   . Pregnancy induced hypertension     HELLP syndrome with first pregnancy  . Anxiety     hx of panic attacks  . HELLP syndrome in third trimester 2011  . Headache(784.0)    . Desires VBAC (vaginal birth after cesarean) trial 04/09/2012  . Failed trial of labor with baby delivered 04/09/2012  . S/P cesarean section 04/09/2012  . Vacuum-assisted cesarian delivery, delivered, current hospitalization 04/09/2012  . S/P tubal ligation 04/12/2012    Prior to Admission medications   Medication Sig Start Date End Date Taking? Authorizing Provider  ibuprofen (ADVIL,MOTRIN) 200 MG tablet Take 200 mg by mouth every 6 (six) hours as needed for moderate pain.   Yes Historical Provider, MD   No Known Allergies    Review of Systems  Constitutional: Positive for fever (resolved). Negative for unexpected weight change.  Genitourinary: Positive for frequency and pelvic pain (when urinating).       Objective:   Physical Exam  Constitutional: She is oriented to person, place, and time. She appears well-developed and well-nourished. No distress.  HENT:  Right Ear: External ear normal.  Left Ear: External ear normal.  Mouth/Throat: Oropharynx is clear and moist.  TMs are clear. Nose with clear rhinorrhea. Throat clear.  Eyes: Conjunctivae and EOM are normal. Pupils are equal, round, and reactive to light.  Neck: Normal range of motion. Neck supple. No thyromegaly present.  Cardiovascular: Normal rate and regular rhythm.   Pulmonary/Chest: Effort normal and breath sounds normal.  Lungs are clear to  auscultation.   Musculoskeletal: Normal range of motion. She exhibits no edema or tenderness.  Joints are not swollen or appreciably tender.  Lymphadenopathy:    She has no cervical adenopathy.  Neurological: She is alert and oriented to person, place, and time. She has normal reflexes. No cranial nerve deficit.  Skin: No rash noted.  Psychiatric: She has a normal mood and affect. Her behavior is normal. Thought content normal.  Nursing note and vitals reviewed.  BP 130/72 mmHg  Pulse 101  Temp(Src) 98.9 F (37.2 C) (Oral)  Resp 20  Ht 5' 6.75" (1.695 m)  Wt 197 lb 8 oz  (89.585 kg)  BMI 31.18 kg/m2  SpO2 97%  LMP 08/20/2014 (Approximate)   Results for orders placed or performed in visit on 09/03/14  POCT UA - Microscopic Only  Result Value Ref Range   WBC, Ur, HPF, POC 0-2    RBC, urine, microscopic 0-1    Bacteria, U Microscopic trace    Mucus, UA neg    Epithelial cells, urine per micros 0-2    Crystals, Ur, HPF, POC neg    Casts, Ur, LPF, POC neg    Yeast, UA neg   POCT urinalysis dipstick  Result Value Ref Range   Color, UA yellow    Clarity, UA clear    Glucose, UA neg    Bilirubin, UA neg    Ketones, UA neg    Spec Grav, UA 1.020    Blood, UA neg    pH, UA 7.5    Protein, UA neg    Urobilinogen, UA 0.2    Nitrite, UA neg    Leukocytes, UA Negative        Assessment & Plan:  Fever, viral syndrome likely--- meloxicam for 7 days for symptom relief  Frequent urination - reassured urinalysis normal  Myalgia and Arthralgia--- unclear etiology of this ongoing problem  She may respond to treatment with meloxicam and stretching but if not should have blood work to rule out  rheumatic conditions   Follow-up with PCP I have completed the patient encounter in its entirety as documented by the scribe, with editing by me where necessary. Amish Mintzer P. Laney Pastor, M.D.

## 2015-08-04 ENCOUNTER — Emergency Department (HOSPITAL_COMMUNITY)
Admission: EM | Admit: 2015-08-04 | Discharge: 2015-08-04 | Disposition: A | Discharge status: Home / Self Care | Payer: BC Managed Care – PPO | Attending: Emergency Medicine | Admitting: Emergency Medicine

## 2015-08-04 ENCOUNTER — Encounter (HOSPITAL_COMMUNITY): Payer: Self-pay | Admitting: Emergency Medicine

## 2015-08-04 ENCOUNTER — Emergency Department (HOSPITAL_COMMUNITY): Payer: BC Managed Care – PPO

## 2015-08-04 DIAGNOSIS — N898 Other specified noninflammatory disorders of vagina: Secondary | ICD-10-CM | POA: Insufficient documentation

## 2015-08-04 DIAGNOSIS — R1032 Left lower quadrant pain: Secondary | ICD-10-CM | POA: Insufficient documentation

## 2015-08-04 DIAGNOSIS — R1012 Left upper quadrant pain: Secondary | ICD-10-CM | POA: Diagnosis not present

## 2015-08-04 DIAGNOSIS — R109 Unspecified abdominal pain: Secondary | ICD-10-CM

## 2015-08-04 DIAGNOSIS — Z3202 Encounter for pregnancy test, result negative: Secondary | ICD-10-CM | POA: Insufficient documentation

## 2015-08-04 DIAGNOSIS — Z791 Long term (current) use of non-steroidal anti-inflammatories (NSAID): Secondary | ICD-10-CM | POA: Insufficient documentation

## 2015-08-04 DIAGNOSIS — Z9851 Tubal ligation status: Secondary | ICD-10-CM | POA: Diagnosis not present

## 2015-08-04 DIAGNOSIS — Z8659 Personal history of other mental and behavioral disorders: Secondary | ICD-10-CM | POA: Insufficient documentation

## 2015-08-04 LAB — COMPREHENSIVE METABOLIC PANEL
ALT: 19 U/L (ref 14–54)
AST: 24 U/L (ref 15–41)
Albumin: 4.1 g/dL (ref 3.5–5.0)
Alkaline Phosphatase: 50 U/L (ref 38–126)
Anion gap: 10 (ref 5–15)
BUN: 10 mg/dL (ref 6–20)
CHLORIDE: 104 mmol/L (ref 101–111)
CO2: 24 mmol/L (ref 22–32)
Calcium: 9.3 mg/dL (ref 8.9–10.3)
Creatinine, Ser: 0.61 mg/dL (ref 0.44–1.00)
GFR calc Af Amer: >60 mL/min (ref >60)
Glucose, Bld: 100 mg/dL — ABNORMAL HIGH (ref 65–99)
Potassium: 3.8 mmol/L (ref 3.5–5.1)
Sodium: 138 mmol/L (ref 135–145)
Total Bilirubin: 0.5 mg/dL (ref 0.3–1.2)
Total Protein: 7.6 g/dL (ref 6.5–8.1)

## 2015-08-04 LAB — CBC WITH DIFFERENTIAL/PLATELET
BASOS PCT: 0 %
Basophils Absolute: 0 10*3/uL (ref 0.0–0.1)
Eosinophils Absolute: 0.1 10*3/uL (ref 0.0–0.7)
Eosinophils Relative: 1 %
HEMATOCRIT: 37.5 % (ref 36.0–46.0)
HEMOGLOBIN: 10.8 g/dL — AB (ref 12.0–15.0)
Lymphocytes Relative: 30 %
Lymphs Abs: 2 10*3/uL (ref 0.7–4.0)
MCH: 24.2 pg — AB (ref 26.0–34.0)
MCHC: 28.8 g/dL — AB (ref 30.0–36.0)
MCV: 84.1 fL (ref 78.0–100.0)
MONOS PCT: 5 %
Monocytes Absolute: 0.3 10*3/uL (ref 0.1–1.0)
NEUTROS ABS: 4.2 10*3/uL (ref 1.7–7.7)
Neutrophils Relative %: 64 %
Platelets: 240 10*3/uL (ref 150–400)
RBC: 4.46 MIL/uL (ref 3.87–5.11)
RDW: 12.2 % (ref 11.5–15.5)
WBC: 6.7 10*3/uL (ref 4.0–10.5)

## 2015-08-04 LAB — WET PREP, GENITAL
CLUE CELLS WET PREP: NONE SEEN
Sperm: NONE SEEN
Trich, Wet Prep: NONE SEEN
WBC, Wet Prep HPF POC: NONE SEEN
Yeast Wet Prep HPF POC: NONE SEEN

## 2015-08-04 LAB — URINALYSIS, ROUTINE W REFLEX MICROSCOPIC
Bilirubin Urine: NEGATIVE
GLUCOSE, UA: NEGATIVE mg/dL
Hgb urine dipstick: NEGATIVE
KETONES UR: NEGATIVE mg/dL
Leukocytes, UA: NEGATIVE
Nitrite: NEGATIVE
PH: 7.5 (ref 5.0–8.0)
Protein, ur: NEGATIVE mg/dL
Specific Gravity, Urine: 1.025 (ref 1.005–1.030)

## 2015-08-04 LAB — LIPASE, BLOOD: LIPASE: 27 U/L (ref 11–51)

## 2015-08-04 LAB — POC URINE PREG, ED: Preg Test, Ur: NEGATIVE

## 2015-08-04 MED ORDER — IBUPROFEN 800 MG PO TABS: 800.0000 mg | ORAL_TABLET | Freq: Three times a day (TID) | ORAL | Status: AC

## 2015-08-04 MED ORDER — CALCIUM POLYCARBOPHIL 625 MG PO TABS: 1250.0000 mg | ORAL_TABLET | Freq: Every day | ORAL | Status: AC

## 2015-08-04 MED ORDER — MORPHINE SULFATE (PF) 4 MG/ML IV SOLN
4.0000 mg | Freq: Once | INTRAVENOUS | Status: DC
  Filled 2015-08-04: qty 1

## 2015-08-04 MED ORDER — MORPHINE SULFATE (PF) 4 MG/ML IV SOLN
4.0000 mg | Freq: Once | INTRAVENOUS | Status: AC
  Administered 2015-08-04: 4 mg via INTRAVENOUS

## 2015-08-04 NOTE — ED Provider Notes (Signed)
CSN: [id number]     Arrival date & time 08/04/15  1109 History   First MD Initiated Contact with Patient 08/04/15 1402     Chief Complaint  Patient presents with  . Flank Pain     (Consider location/radiation/quality/duration/timing/severity/associated sxs/prior Treatment) Patient is a 25 y.o. female presenting with flank pain.  Flank Pain Associated symptoms include abdominal pain. Pertinent negatives include no chest pain, chills, coughing, fever, nausea, urinary symptoms or vomiting.   Christina [name] is a 25 y.o. female with PMH significant for ovarian cyst (right), depression, anxiety, tubal ligation who presents with gradual onset, constant, worsening left flank pain that began 2 days ago and became worse last night.  She describes it as an aching, burning pain.  No modifying factors.  She states last night, when the pain got worse she was unable to find a comfortable position.  Denies fever, chills, N/V, SOB, CP, cough, bloody stools, urinary symptoms, or vaginal complaints.  LNMP 07/21/15.   Past Medical History  Diagnosis Date  . Ovarian cyst   . HELLP syndrome   . Depression   . Pregnancy induced hypertension     HELLP syndrome with first pregnancy  . Anxiety     hx of panic attacks  . HELLP syndrome in third trimester 2011  . Headache(784.0)   . Desires VBAC (vaginal birth after cesarean) trial 04/09/2012  . Failed trial of labor with baby delivered 04/09/2012  . S/P cesarean section 04/09/2012  . Vacuum-assisted cesarian delivery, delivered, current hospitalization 04/09/2012  . S/P tubal ligation 04/12/2012   Past Surgical History  Procedure Laterality Date  . Cesarean section    . Cesarean section  04/09/2012    Procedure: CESAREAN SECTION;  Surgeon: Thornell Sartorius, MD;  Location: [facility];  Service: Obstetrics;  Laterality: N/A;  Repeat cesarean section with delivery of baby boy at [phone].   Apgars 9/9.   Bilateral tubal ligation  . Tubal ligation     Family History  Problem  Relation Age of Onset  . Anesthesia problems Neg Hx   . Other Neg Hx   . Thyroid disease Mother   . Hypertension Mother   . Anxiety disorder Mother   . Cancer Maternal Grandmother   . Hypertension Maternal Grandfather    Social History  Substance Use Topics  . Smoking status: Never Smoker   . Smokeless tobacco: Never Used  . Alcohol Use: No   OB History    Gravida Para Term Preterm AB TAB SAB Ectopic Multiple Living   2 2 1 1      2      Review of Systems  Constitutional: Negative for fever and chills.  Respiratory: Negative for cough.   Cardiovascular: Negative for chest pain.  Gastrointestinal: Positive for abdominal pain. Negative for nausea and vomiting.  Genitourinary: Positive for flank pain. Negative for dysuria, urgency, hematuria, vaginal bleeding and vaginal discharge.  All other systems reviewed and are negative.     Allergies  Review of patient's allergies indicates no known allergies.  Home Medications   Prior to Admission medications   Medication Sig Start Date End Date Taking? Authorizing Provider  meloxicam (MOBIC) 15 MG tablet Take 1 tablet (15 mg total) by mouth daily. 09/03/14   Leandrew Koyanagi, MD   BP 127/77 mmHg  Pulse 74  Temp(Src) 99.1 F (37.3 C) (Oral)  Resp 18  Ht 5\' 7"  (1.702 m)  Wt 90.719 kg  BMI 31.32 kg/m2  SpO2 100% Physical Exam  Constitutional:  She is oriented to person, place, and time. She appears well-developed and well-nourished.  Non-toxic appearance. She does not have a sickly appearance. She does not appear ill.  HENT:  Head: Normocephalic and atraumatic.  Mouth/Throat: Oropharynx is clear and moist.  Eyes: Conjunctivae are normal. Pupils are equal, round, and reactive to light.  Neck: Normal range of motion. Neck supple.  Cardiovascular: Normal rate, regular rhythm and normal heart sounds.   No murmur heard. Pulmonary/Chest: Effort normal and breath sounds normal. No accessory muscle usage or stridor. No respiratory  distress. She has no wheezes. She has no rhonchi. She has no rales.  Abdominal: Soft. Bowel sounds are normal. She exhibits no distension. There is tenderness in the suprapubic area, left upper quadrant and left lower quadrant. There is no rigidity, no rebound, no guarding and no CVA tenderness.  Genitourinary: Uterus normal. Uterus is not tender. Cervix exhibits discharge (mild white). Cervix exhibits no motion tenderness. Right adnexum displays no mass and no tenderness. Left adnexum displays no mass and no tenderness. No erythema or bleeding in the vagina. No foreign body around the vagina. No signs of injury around the vagina. Vaginal discharge (mild white) found.  Musculoskeletal: Normal range of motion.  Lymphadenopathy:    She has no cervical adenopathy.  Neurological: She is alert and oriented to person, place, and time.  Speech clear without dysarthria.  Skin: Skin is warm and dry.  Psychiatric: She has a normal mood and affect. Her behavior is normal.    ED Course  Procedures (including critical care time) Labs Review Labs Reviewed  URINALYSIS, [place] (NOT AT [facility]) - Abnormal; Notable for the following:    APPearance HAZY (*)    All other components within normal limits  CBC WITH DIFFERENTIAL/PLATELET - Abnormal; Notable for the following:    Hemoglobin 10.8 (*)    MCH 24.2 (*)    MCHC 28.8 (*)    All other components within normal limits  COMPREHENSIVE METABOLIC PANEL - Abnormal; Notable for the following:    Glucose, Bld 100 (*)    All other components within normal limits  WET PREP, GENITAL  LIPASE, BLOOD  RPR  HIV ANTIBODY (ROUTINE TESTING)  POC URINE PREG, ED  GC/CHLAMYDIA PROBE AMP ([place]) NOT AT [facility]    Imaging Review Ct Renal Stone Study  08/04/2015  CLINICAL DATA:  Left flank pain EXAM: CT ABDOMEN AND PELVIS WITHOUT CONTRAST TECHNIQUE: Multidetector CT imaging of the abdomen and pelvis was performed following the standard protocol  without IV contrast. COMPARISON:  None. FINDINGS: Lower chest:  No acute findings. Hepatobiliary: No mass visualized on this un-enhanced exam. Pancreas: No mass or inflammatory process identified on this un-enhanced exam. Spleen: Within normal limits in size. Adrenals/Urinary Tract: Adrenal glands appear normal. Kidneys are unremarkable without stone or hydronephrosis. No perinephric fluid. No ureteral or bladder calculi identified. Bladder is unremarkable. Stomach/Bowel: Bowel is normal in caliber. No bowel wall thickening or evidence of bowel wall inflammation. Moderate amount of stool and gas throughout the colon. Appendix appears normal. Vascular/Lymphatic: Abdominal aorta is normal in caliber. No enlarged lymph nodes seen. Reproductive: No mass or other significant abnormality. Other: Trace free fluid in the lower pelvis is likely physiologic in nature. No other free fluid. No abscess collection seen. No free intraperitoneal air. Musculoskeletal:  No acute or suspicious osseous lesion. IMPRESSION: Normal noncontrast abdomen and pelvis CT. Moderate amount of stool and gas throughout the nondistended colon (constipation?). Electronically Signed   By: Cherlynn Kaiser  Enriqueta Shutter M.D.   On: 08/04/2015 14:54   I have personally reviewed and evaluated these images and lab results as part of my medical decision-making.   EKG Interpretation None      MDM   Final diagnoses:  Left flank pain  Abdominal pain, unspecified abdominal location   Patient presents with left flank pain.  No fever, chills, N/V, bloody stools.  VSS, NAD.  On exam, heart RRR, lungs CTAB, abdomen soft with LUQ, LLQ, and suprapubic tenderness.  No rebound, guarding, or rigidity.  No CVA tenderness.  Urine pregnancy negative.  UA negative.  Doubt pyelonephritis.  GU exam, no CMT or adnexal tenderness, mild white cervical discharge. Doubt ovarian torsion, TOA, or PID. Concern for nephrolithiasis versus musculoskeletal.  Will obtain CBC, CMP, lipase,  and CT Renal stone study. Labs without acute abnormalities. Wet prep negative. GC, chlamydia, HIV, RPR pending. CT renal stone study negative for acute abnormalities, moderate amount of stool and gas throughout nondistended colon that could possibly reflect constipation.  Plan to discharge home with fibercon and ibuprofen.  Discussed return precautions.  Patient agrees and acknowledges the above plan for discharge.  Case has been discussed with Dr. Tomi Bamberger who agrees with the above plan for discharge.      Gloriann Loan, PA-C 08/04/15 1544  Dorie Rank, MD 08/04/15 [phone]

## 2015-08-04 NOTE — Discharge Instructions (Signed)
Abdominal Pain, Adult °Many things can cause abdominal pain. Usually, abdominal pain is not caused by a disease and will improve without treatment. It can often be observed and treated at home. Your health care provider will do a physical exam and possibly order blood tests and X-rays to help determine the seriousness of your pain. However, in many cases, more time must pass before a clear cause of the pain can be found. Before that point, your health care provider may not know if you need more testing or further treatment. °HOME CARE INSTRUCTIONS °Monitor your abdominal pain for any changes. The following actions may help to alleviate any discomfort you are experiencing: °· Only take over-the-counter or prescription medicines as directed by your health care provider. °· Do not take laxatives unless directed to do so by your health care provider. °· Try a clear liquid diet (broth, tea, or water) as directed by your health care provider. Slowly move to a bland diet as tolerated. °SEEK MEDICAL CARE IF: °· You have unexplained abdominal pain. °· You have abdominal pain associated with nausea or diarrhea. °· You have pain when you urinate or have a bowel movement. °· You experience abdominal pain that wakes you in the night. °· You have abdominal pain that is worsened or improved by eating food. °· You have abdominal pain that is worsened with eating fatty foods. °· You have a fever. °SEEK IMMEDIATE MEDICAL CARE IF: °· Your pain does not go away within 2 hours. °· You keep throwing up (vomiting). °· Your pain is felt only in portions of the abdomen, such as the right side or the left lower portion of the abdomen. °· You pass bloody or black tarry stools. °MAKE SURE YOU: °· Understand these instructions. °· Will watch your condition. °· Will get help right away if you are not doing well or get worse. °  °This information is not intended to replace advice given to you by your health care provider. Make sure you discuss  any questions you have with your health care provider. °  °Document Released: 02/03/2005 Document Revised: 01/15/2015 Document Reviewed: 01/03/2013 °Elsevier Interactive Patient Education ©2016 [employer] ° °Constipation, Adult °Constipation is when a person has fewer than three bowel movements a week, has difficulty having a bowel movement, or has stools that are dry, hard, or larger than normal. As people grow older, constipation is more common. A low-fiber diet, not taking in enough fluids, and taking certain medicines may make constipation worse.  °CAUSES  °· Certain medicines, such as antidepressants, pain medicine, iron supplements, antacids, and water pills.   °· Certain diseases, such as diabetes, irritable bowel syndrome (IBS), thyroid disease, or depression.   °· Not drinking enough water.   °· Not eating enough fiber-rich foods.   °· Stress or travel.   °· Lack of physical activity or exercise.   °· Ignoring the urge to have a bowel movement.   °· Using laxatives too much.   °SIGNS AND SYMPTOMS  °· Having fewer than three bowel movements a week.   °· Straining to have a bowel movement.   °· Having stools that are hard, dry, or larger than normal.   °· Feeling full or bloated.   °· Pain in the lower abdomen.   °· Not feeling relief after having a bowel movement.   °DIAGNOSIS  °Your health care provider will take a medical history and perform a physical exam. Further testing may be done for severe constipation. Some tests may include: °· A barium enema X-ray to examine your rectum, colon, and, sometimes,   your small intestine.   °· A sigmoidoscopy to examine your lower colon.   °· A colonoscopy to examine your entire colon. °TREATMENT  °Treatment will depend on the severity of your constipation and what is causing it. Some dietary treatments include drinking more fluids and eating more fiber-rich foods. Lifestyle treatments may include regular exercise. If these diet and lifestyle recommendations do not  help, your health care provider may recommend taking over-the-counter laxative medicines to help you have bowel movements. Prescription medicines may be prescribed if over-the-counter medicines do not work.  °HOME CARE INSTRUCTIONS  °· Eat foods that have a lot of fiber, such as fruits, vegetables, whole grains, and beans. °· Limit foods high in fat and processed sugars, such as french fries, hamburgers, cookies, candies, and soda.   °· A fiber supplement may be added to your diet if you cannot get enough fiber from foods.   °· Drink enough fluids to keep your urine clear or pale yellow.   °· Exercise regularly or as directed by your health care provider.   °· Go to the restroom when you have the urge to go. Do not hold it.   °· Only take over-the-counter or prescription medicines as directed by your health care provider. Do not take other medicines for constipation without talking to your health care provider first.   °SEEK IMMEDIATE MEDICAL CARE IF:  °· You have bright red blood in your stool.   °· Your constipation lasts for more than 4 days or gets worse.   °· You have abdominal or rectal pain.   °· You have thin, pencil-like stools.   °· You have unexplained weight loss. °MAKE SURE YOU:  °· Understand these instructions. °· Will watch your condition. °· Will get help right away if you are not doing well or get worse. °  °This information is not intended to replace advice given to you by your health care provider. Make sure you discuss any questions you have with your health care provider. °  °Document Released: 01/23/2004 Document Revised: 05/17/2014 Document Reviewed: 02/05/2013 °Elsevier Interactive Patient Education ©2016 [employer] ° °

## 2015-08-04 NOTE — ED Notes (Signed)
pts vital signs updated and iv removed. Pt awaiting discharge paperwork at bedside.

## 2015-08-04 NOTE — ED Notes (Signed)
Pt sts left flank pain that was worse last night and now more soreness that goes into back; pt sts hx of cysts and unsure if could be same; pt LMP was 07/21/15

## 2015-08-05 LAB — RPR: RPR Ser Ql: NONREACTIVE

## 2015-08-05 LAB — GC/CHLAMYDIA PROBE AMP (~~LOC~~) NOT AT ARMC
CHLAMYDIA, DNA PROBE: NEGATIVE
NEISSERIA GONORRHEA: NEGATIVE

## 2015-08-05 LAB — HIV ANTIBODY (ROUTINE TESTING W REFLEX): HIV SCREEN 4TH GENERATION: NONREACTIVE

## 2023-05-27 ENCOUNTER — Other Ambulatory Visit (HOSPITAL_COMMUNITY): Payer: Self-pay
